# Patient Record
Sex: Male | Born: 1975 | Race: White | Hispanic: No | Marital: Single | Smoking: Never smoker
Health system: Southern US, Community
[De-identification: ages and names within clinical notes are randomized; demographics above are authoritative.]

## PROBLEM LIST (undated history)

## (undated) DIAGNOSIS — R079 Chest pain, unspecified: Secondary | ICD-10-CM

## (undated) DIAGNOSIS — R0602 Shortness of breath: Secondary | ICD-10-CM

## (undated) DIAGNOSIS — M549 Dorsalgia, unspecified: Secondary | ICD-10-CM

## (undated) HISTORY — DX: Shortness of breath: R06.02

## (undated) HISTORY — DX: Chest pain, unspecified: R07.9

## (undated) HISTORY — PX: APPENDECTOMY: SHX54

---

## 1999-03-29 ENCOUNTER — Emergency Department (HOSPITAL_COMMUNITY): Admission: EM | Admit: 1999-03-29 | Discharge: 1999-03-29 | Payer: Self-pay | Admitting: Emergency Medicine

## 2007-01-15 ENCOUNTER — Emergency Department (HOSPITAL_COMMUNITY): Admission: EM | Admit: 2007-01-15 | Discharge: 2007-01-15 | Payer: Self-pay | Admitting: Family Medicine

## 2007-06-20 ENCOUNTER — Emergency Department (HOSPITAL_COMMUNITY): Admission: EM | Admit: 2007-06-20 | Discharge: 2007-06-20 | Payer: Self-pay | Admitting: Family Medicine

## 2008-05-15 ENCOUNTER — Emergency Department (HOSPITAL_COMMUNITY): Admission: EM | Admit: 2008-05-15 | Discharge: 2008-05-15 | Payer: Self-pay | Admitting: Emergency Medicine

## 2008-05-26 ENCOUNTER — Emergency Department (HOSPITAL_COMMUNITY): Admission: EM | Admit: 2008-05-26 | Discharge: 2008-05-26 | Payer: Self-pay | Admitting: Family Medicine

## 2008-12-17 ENCOUNTER — Emergency Department (HOSPITAL_COMMUNITY): Admission: EM | Admit: 2008-12-17 | Discharge: 2008-12-17 | Payer: Self-pay | Admitting: Family Medicine

## 2009-04-03 ENCOUNTER — Emergency Department (HOSPITAL_COMMUNITY): Admission: EM | Admit: 2009-04-03 | Discharge: 2009-04-03 | Payer: Self-pay | Admitting: Family Medicine

## 2009-04-06 ENCOUNTER — Emergency Department (HOSPITAL_COMMUNITY): Admission: EM | Admit: 2009-04-06 | Discharge: 2009-04-06 | Payer: Self-pay | Admitting: Emergency Medicine

## 2009-08-29 ENCOUNTER — Encounter (INDEPENDENT_AMBULATORY_CARE_PROVIDER_SITE_OTHER): Payer: Self-pay | Admitting: Emergency Medicine

## 2009-08-29 ENCOUNTER — Emergency Department (HOSPITAL_COMMUNITY): Admission: EM | Admit: 2009-08-29 | Discharge: 2009-08-29 | Payer: Self-pay | Admitting: Family Medicine

## 2009-08-29 ENCOUNTER — Observation Stay (HOSPITAL_COMMUNITY): Admission: EM | Admit: 2009-08-29 | Discharge: 2009-08-29 | Payer: Self-pay | Admitting: Emergency Medicine

## 2009-08-29 ENCOUNTER — Ambulatory Visit: Payer: Self-pay | Admitting: Cardiology

## 2010-06-01 LAB — POCT I-STAT, CHEM 8
BUN: 11 mg/dL (ref 6–23)
Calcium, Ion: 1.18 mmol/L (ref 1.12–1.32)
Chloride: 104 mEq/L (ref 96–112)
Creatinine, Ser: 0.7 mg/dL (ref 0.4–1.5)
Glucose, Bld: 283 mg/dL — ABNORMAL HIGH (ref 70–99)
HCT: 48 % (ref 39.0–52.0)
Hemoglobin: 16.3 g/dL (ref 13.0–17.0)
Potassium: 4 mEq/L (ref 3.5–5.1)
Sodium: 138 meq/L (ref 135–145)
TCO2: 25 mmol/L (ref 0–100)

## 2010-06-01 LAB — HEMOGLOBIN A1C
Hgb A1c MFr Bld: 8.1 % — ABNORMAL HIGH (ref <5.7)
Mean Plasma Glucose: 186 mg/dL — ABNORMAL HIGH (ref <117)

## 2010-06-01 LAB — POCT CARDIAC MARKERS
CKMB, poc: <1 ng/mL — ABNORMAL LOW (ref 1.0–8.0)
CKMB, poc: <1 ng/mL — ABNORMAL LOW (ref 1.0–8.0)
Myoglobin, poc: 45.7 ng/mL (ref 12–200)
Myoglobin, poc: 46.6 ng/mL (ref 12–200)
Troponin i, poc: <0.05 ng/mL (ref 0.00–0.09)
Troponin i, poc: <0.05 ng/mL (ref 0.00–0.09)

## 2010-09-09 ENCOUNTER — Emergency Department (HOSPITAL_COMMUNITY)
Admission: EM | Admit: 2010-09-09 | Discharge: 2010-09-09 | Disposition: A | Discharge status: Home / Self Care | Payer: Self-pay | Attending: Emergency Medicine | Admitting: Emergency Medicine

## 2010-09-09 DIAGNOSIS — M545 Low back pain, unspecified: Secondary | ICD-10-CM | POA: Insufficient documentation

## 2010-09-09 DIAGNOSIS — M79609 Pain in unspecified limb: Secondary | ICD-10-CM | POA: Insufficient documentation

## 2010-09-09 DIAGNOSIS — R059 Cough, unspecified: Secondary | ICD-10-CM | POA: Insufficient documentation

## 2010-09-09 DIAGNOSIS — E119 Type 2 diabetes mellitus without complications: Secondary | ICD-10-CM | POA: Insufficient documentation

## 2010-09-09 DIAGNOSIS — R05 Cough: Secondary | ICD-10-CM | POA: Insufficient documentation

## 2010-09-09 DIAGNOSIS — Z79899 Other long term (current) drug therapy: Secondary | ICD-10-CM | POA: Insufficient documentation

## 2010-09-14 ENCOUNTER — Ambulatory Visit (INDEPENDENT_AMBULATORY_CARE_PROVIDER_SITE_OTHER): Payer: Self-pay

## 2010-09-14 ENCOUNTER — Inpatient Hospital Stay (INDEPENDENT_AMBULATORY_CARE_PROVIDER_SITE_OTHER)
Admission: RE | Admit: 2010-09-14 | Discharge: 2010-09-14 | Disposition: A | Discharge status: Home / Self Care | Payer: Self-pay | Source: Ambulatory Visit | Attending: Family Medicine | Admitting: Family Medicine

## 2010-09-14 DIAGNOSIS — M549 Dorsalgia, unspecified: Secondary | ICD-10-CM

## 2010-09-14 DIAGNOSIS — M461 Sacroiliitis, not elsewhere classified: Secondary | ICD-10-CM

## 2010-10-27 ENCOUNTER — Inpatient Hospital Stay (INDEPENDENT_AMBULATORY_CARE_PROVIDER_SITE_OTHER)
Admission: RE | Admit: 2010-10-27 | Discharge: 2010-10-27 | Disposition: A | Discharge status: Home / Self Care | Payer: Self-pay | Source: Ambulatory Visit | Attending: Emergency Medicine | Admitting: Emergency Medicine

## 2010-10-27 DIAGNOSIS — S335XXA Sprain of ligaments of lumbar spine, initial encounter: Secondary | ICD-10-CM

## 2010-12-01 ENCOUNTER — Emergency Department (HOSPITAL_COMMUNITY)
Admission: EM | Admit: 2010-12-01 | Discharge: 2010-12-01 | Disposition: A | Discharge status: Home / Self Care | Payer: Self-pay | Attending: Emergency Medicine | Admitting: Emergency Medicine

## 2010-12-01 DIAGNOSIS — M545 Low back pain, unspecified: Secondary | ICD-10-CM | POA: Insufficient documentation

## 2010-12-01 DIAGNOSIS — IMO0001 Reserved for inherently not codable concepts without codable children: Secondary | ICD-10-CM | POA: Insufficient documentation

## 2010-12-01 DIAGNOSIS — E119 Type 2 diabetes mellitus without complications: Secondary | ICD-10-CM | POA: Insufficient documentation

## 2010-12-01 DIAGNOSIS — M79609 Pain in unspecified limb: Secondary | ICD-10-CM | POA: Insufficient documentation

## 2010-12-01 DIAGNOSIS — M5430 Sciatica, unspecified side: Secondary | ICD-10-CM | POA: Insufficient documentation

## 2010-12-01 DIAGNOSIS — G8929 Other chronic pain: Secondary | ICD-10-CM | POA: Insufficient documentation

## 2011-03-03 ENCOUNTER — Other Ambulatory Visit (HOSPITAL_COMMUNITY): Payer: Self-pay | Admitting: Internal Medicine

## 2011-03-03 DIAGNOSIS — M545 Low back pain: Secondary | ICD-10-CM

## 2011-03-04 ENCOUNTER — Ambulatory Visit (HOSPITAL_COMMUNITY)
Admission: RE | Admit: 2011-03-04 | Discharge: 2011-03-04 | Disposition: A | Discharge status: Home / Self Care | Payer: Self-pay | Source: Ambulatory Visit | Attending: Internal Medicine | Admitting: Internal Medicine

## 2011-03-04 ENCOUNTER — Other Ambulatory Visit (HOSPITAL_COMMUNITY): Payer: Self-pay | Admitting: Internal Medicine

## 2011-03-04 DIAGNOSIS — G8929 Other chronic pain: Secondary | ICD-10-CM

## 2011-03-04 DIAGNOSIS — W311XXA Contact with metalworking machines, initial encounter: Secondary | ICD-10-CM

## 2011-03-04 DIAGNOSIS — Z1389 Encounter for screening for other disorder: Secondary | ICD-10-CM | POA: Insufficient documentation

## 2011-03-04 DIAGNOSIS — R29898 Other symptoms and signs involving the musculoskeletal system: Secondary | ICD-10-CM | POA: Insufficient documentation

## 2011-03-04 DIAGNOSIS — M545 Low back pain, unspecified: Secondary | ICD-10-CM | POA: Insufficient documentation

## 2011-06-08 ENCOUNTER — Encounter (HOSPITAL_COMMUNITY): Payer: Self-pay | Admitting: Emergency Medicine

## 2011-06-08 ENCOUNTER — Emergency Department (HOSPITAL_COMMUNITY)
Admission: EM | Admit: 2011-06-08 | Discharge: 2011-06-09 | Disposition: A | Discharge status: Home / Self Care | Payer: Self-pay | Attending: Emergency Medicine | Admitting: Emergency Medicine

## 2011-06-08 DIAGNOSIS — E119 Type 2 diabetes mellitus without complications: Secondary | ICD-10-CM | POA: Insufficient documentation

## 2011-06-08 DIAGNOSIS — G8929 Other chronic pain: Secondary | ICD-10-CM | POA: Insufficient documentation

## 2011-06-08 DIAGNOSIS — L0231 Cutaneous abscess of buttock: Secondary | ICD-10-CM | POA: Insufficient documentation

## 2011-06-08 DIAGNOSIS — L03317 Cellulitis of buttock: Secondary | ICD-10-CM | POA: Insufficient documentation

## 2011-06-08 NOTE — ED Notes (Signed)
Pt states he has an abscess to his left buttock  Pt states it started on Friday and has progressively gotten worse

## 2011-06-09 MED ORDER — OXYCODONE-ACETAMINOPHEN 5-325 MG PO TABS: ORAL_TABLET | ORAL | Status: AC

## 2011-06-09 MED ORDER — SULFAMETHOXAZOLE-TRIMETHOPRIM 800-160 MG PO TABS: 1.0000 | ORAL_TABLET | Freq: Two times a day (BID) | ORAL | Status: AC

## 2011-06-09 MED ORDER — IBUPROFEN 800 MG PO TABS
800.0000 mg | ORAL_TABLET | Freq: Once | ORAL | Status: AC
  Administered 2011-06-09: 800 mg via ORAL
  Filled 2011-06-09: qty 1

## 2011-06-09 NOTE — ED Notes (Signed)
Pt reports having an area on left buttock that started hurting for past couple of days. Thought he got bitten by an insect or had hemorrhoids. Pt has a reddened area approximately the size of half dollar that is swollen and inflamed. Exudate oozes from site when site is pressed down.

## 2011-06-09 NOTE — ED Notes (Signed)
Drucie Opitz, PA at the bedside assessing pt.

## 2011-06-09 NOTE — ED Provider Notes (Signed)
Medical screening examination/treatment/procedure(s) were performed by non-physician practitioner and as supervising physician I was immediately available for consultation/collaboration.   Vida Roller, MD 06/09/11 [id number]

## 2011-06-09 NOTE — ED Provider Notes (Signed)
History     CSN: [id number]  Arrival date & time 06/08/11  2218   First MD Initiated Contact with Patient 06/08/11 2353      Chief Complaint  Patient presents with  . Abscess    (Consider location/radiation/quality/duration/timing/severity/associated sxs/prior treatment) HPI  Patient who is a non-insulin-dependent diabetic with chronic back pain presents to emergency department complaining of a few day history of gradual onset pain in his left inner buttock stating "I felt a bump there." Patient states he first felt a bump on the left inner aspect of his buttock a few days ago her last few days the area has become more hot and tender. Patient states increased pain with sitting. Patient states that today he began to have some drainage from the area. Patient denies any pain with defecation, fevers, chills, or abdominal pain. Patient states she's taken at home Percocet with some mild relief of pain. Pain was gradual onset, persistent, and worsening. Pain is aggravated by sitting and touch and improved with lying on his belly.  Past Medical History  Diagnosis Date  . Diabetes mellitus     Past Surgical History  Procedure Date  . Appendectomy     Family History  Problem Relation Age of Onset  . Diabetes Other   . Cancer Other     History  Substance Use Topics  . Smoking status: Never Smoker   . Smokeless tobacco: Not on file  . Alcohol Use: No      Review of Systems  All other systems reviewed and are negative.    Allergies  Review of patient's allergies indicates no known allergies.  Home Medications   Current Outpatient Rx  Name Route Sig Dispense Refill  . DICLOFENAC SODIUM 50 MG PO TBEC Oral Take 50 mg by mouth 2 (two) times daily.    Marland Kitchen METFORMIN HCL 500 MG PO TABS Oral Take 500 mg by mouth daily with breakfast.    . OXYCODONE-ACETAMINOPHEN 5-325 MG PO TABS Oral Take 1 tablet by mouth every 4 (four) hours as needed.      BP 142/86  Pulse 97  Temp(Src)  98.1 F (36.7 C) (Oral)  Resp 20  SpO2 99%  Physical Exam  Nursing note and vitals reviewed. Constitutional: He is oriented to person, place, and time. He appears well-developed and well-nourished. No distress.  HENT:  Head: Normocephalic and atraumatic.  Eyes: Conjunctivae are normal.  Neck: Normal range of motion. Neck supple.  Cardiovascular: Normal rate, regular rhythm, normal heart sounds and intact distal pulses.  Exam reveals no gallop and no friction rub.   No murmur heard. Pulmonary/Chest: Effort normal and breath sounds normal. No respiratory distress. He has no wheezes. He has no rales. He exhibits no tenderness.  Abdominal: Soft. Bowel sounds are normal. He exhibits no distension and no mass. There is no tenderness. There is no rebound and no guarding.  Genitourinary: Rectum normal.  Musculoskeletal: Normal range of motion. He exhibits no edema and no tenderness.  Neurological: He is alert and oriented to person, place, and time.  Skin: Skin is warm and dry. No rash noted. He is not diaphoretic. No erythema.       Quarter sized area of erythema and induration of left inner buttock with blood tinged purulent drainage. Moderate TTP. No surrounding soft tissue crepitous or erythema or TTP.   Psychiatric: He has a normal mood and affect.    ED Course  Procedures (including critical care time)  Patient drove self to ER.  PO ibuprofen  INCISION AND DRAINAGE Performed by: Jenness Corner Consent: Verbal consent obtained. Risks and benefits: risks, benefits and alternatives were discussed Type: abscess  Body area: left inner buttock  Anesthesia: local infiltration  Local anesthetic: lidocaine 2% with epinephrine  Anesthetic total: 8 ml  Complexity: complex Blunt dissection to break up loculations  Drainage: purulent  Drainage amount: moderate amount  Packing material: none  Patient tolerance: Patient tolerated the procedure well with no immediate  complications.     Labs Reviewed - No data to display No results found.   1. Abscess of buttock       MDM  Abscess without signs or symptoms of associated cellulitis or deeper infection. No pain with defecation. No rectal tenderness to palpation. Patient afebrile nontoxic-appearing. Abdomen soft nontender. Patient is appropriate for outpatient treatment of abscess with strict precautions to return to ER given. Patient voices understanding are agreeable to        Jenness Corner, [place] 06/09/11 0201

## 2011-06-09 NOTE — Discharge Instructions (Signed)
Apply warm compresses to buttock throughout the day. Take antibiotic and completion. Take percocet as directed, as needed for pain but do not drive or operate machinery with pain medication use. Followup with [facility] in 3 days for wound recheck for ongoing redness or questions of healing.  However return to emergency department for emergent changing or worsening symptoms.   Abscess An abscess (boil or furuncle) is an infected area that contains a collection of pus.  SYMPTOMS Signs and symptoms of an abscess include pain, tenderness, redness, or hardness. You may feel a moveable soft area under your skin. An abscess can occur anywhere in the body.  TREATMENT  A surgical cut (incision) may be made over your abscess to drain the pus. Gauze may be packed into the space or a drain may be looped through the abscess cavity (pocket). This provides a drain that will allow the cavity to heal from the inside outwards. The abscess may be painful for a few days, but should feel much better if it was drained.  Your abscess, if seen early, may not have localized and may not have been drained. If not, another appointment may be required if it does not get better on its own or with medications. HOME CARE INSTRUCTIONS   Only take over-the-counter or prescription medicines for pain, discomfort, or fever as directed by your caregiver.   Take your antibiotics as directed if they were prescribed. Finish them even if you start to feel better.   Keep the skin and clothes clean around your abscess.   If the abscess was drained, you will need to use gauze dressing to collect any draining pus. Dressings will typically need to be changed 3 or more times a day.   The infection may spread by skin contact with others. Avoid skin contact as much as possible.   Practice good hygiene. This includes regular hand washing, cover any draining skin lesions, and do not share personal care items.   If you participate  in sports, do not share athletic equipment, towels, whirlpools, or personal care items. Shower after every practice or tournament.   If a draining area cannot be adequately covered:   Do not participate in sports.   Children should not participate in day care until the wound has healed or drainage stops.   If your caregiver has given you a follow-up appointment, it is very important to keep that appointment. Not keeping the appointment could result in a much worse infection, chronic or permanent injury, pain, and disability. If there is any problem keeping the appointment, you must call back to this facility for assistance.  SEEK MEDICAL CARE IF:   You develop increased pain, swelling, redness, drainage, or bleeding in the wound site.   You develop signs of generalized infection including muscle aches, chills, fever, or a general ill feeling.   You have an oral temperature above 102 F (38.9 C).  MAKE SURE YOU:   Understand these instructions.   Will watch your condition.   Will get help right away if you are not doing well or get worse.  Document Released: 12/09/2004 Document Revised: 02/18/2011 Document Reviewed: 10/03/2007 [facility] Patient Information 2012 [place], [place].

## 2011-09-12 ENCOUNTER — Emergency Department (HOSPITAL_COMMUNITY): Payer: Self-pay

## 2011-09-12 ENCOUNTER — Emergency Department (HOSPITAL_COMMUNITY)
Admission: EM | Admit: 2011-09-12 | Discharge: 2011-09-13 | Disposition: A | Discharge status: Home / Self Care | Payer: Self-pay | Attending: Emergency Medicine | Admitting: Emergency Medicine

## 2011-09-12 DIAGNOSIS — R52 Pain, unspecified: Secondary | ICD-10-CM | POA: Insufficient documentation

## 2011-09-12 DIAGNOSIS — R059 Cough, unspecified: Secondary | ICD-10-CM | POA: Insufficient documentation

## 2011-09-12 DIAGNOSIS — R509 Fever, unspecified: Secondary | ICD-10-CM | POA: Insufficient documentation

## 2011-09-12 DIAGNOSIS — R05 Cough: Secondary | ICD-10-CM

## 2011-09-12 DIAGNOSIS — Z79899 Other long term (current) drug therapy: Secondary | ICD-10-CM | POA: Insufficient documentation

## 2011-09-12 LAB — CBC WITH DIFFERENTIAL/PLATELET
Basophils Relative: 2 % — ABNORMAL HIGH (ref 0–1)
Eosinophils Absolute: 0.2 10*3/uL (ref 0.0–0.7)
MCH: 29.3 pg (ref 26.0–34.0)
Monocytes Absolute: 0.6 10*3/uL (ref 0.1–1.0)
Monocytes Relative: 10 % (ref 3–12)
Neutrophils Relative %: 71 % (ref 43–77)
RBC: 5.69 MIL/uL (ref 4.22–5.81)
RDW: 12.8 % (ref 11.5–15.5)

## 2011-09-12 LAB — COMPREHENSIVE METABOLIC PANEL
Albumin: 3.7 g/dL (ref 3.5–5.2)
Alkaline Phosphatase: 115 U/L (ref 39–117)
CO2: 22 mEq/L (ref 19–32)
Calcium: 9.5 mg/dL (ref 8.4–10.5)
Chloride: 98 mEq/L (ref 96–112)
Creatinine, Ser: 0.69 mg/dL (ref 0.50–1.35)
GFR calc Af Amer: >90 mL/min (ref >90)
GFR calc non Af Amer: >90 mL/min (ref >90)
Total Bilirubin: 0.4 mg/dL (ref 0.3–1.2)
Total Protein: 7.3 g/dL (ref 6.0–8.3)

## 2011-09-12 LAB — D-DIMER, QUANTITATIVE: D-Dimer, Quant: 0.48 ug/mL-FEU (ref 0.00–0.48)

## 2011-09-12 MED ORDER — ACETAMINOPHEN 325 MG PO TABS
975.0000 mg | ORAL_TABLET | Freq: Once | ORAL | Status: AC
  Administered 2011-09-12: 975 mg via ORAL
  Filled 2011-09-12: qty 3

## 2011-09-12 MED ORDER — MORPHINE SULFATE 4 MG/ML IJ SOLN
4.0000 mg | Freq: Once | INTRAMUSCULAR | Status: AC
  Administered 2011-09-12: 4 mg via INTRAVENOUS
  Filled 2011-09-12: qty 1

## 2011-09-12 MED ORDER — KETOROLAC TROMETHAMINE 30 MG/ML IJ SOLN
30.0000 mg | Freq: Once | INTRAMUSCULAR | Status: AC
  Administered 2011-09-13: 30 mg via INTRAVENOUS
  Filled 2011-09-12: qty 1

## 2011-09-12 MED ORDER — SODIUM CHLORIDE 0.9 % IV BOLUS (SEPSIS)
1000.0000 mL | Freq: Once | INTRAVENOUS | Status: AC
  Administered 2011-09-12: 1000 mL via INTRAVENOUS

## 2011-09-12 NOTE — ED Notes (Signed)
Per PTAR:  Pt reports fever of 101.0 (reported from mother) this morning with midsternal cp and cough - Pt rates pain 9/10

## 2011-09-13 MED ORDER — AZITHROMYCIN 250 MG PO TABS: 250.0000 mg | ORAL_TABLET | Freq: Every day | ORAL | Status: AC

## 2011-09-13 NOTE — ED Provider Notes (Addendum)
History     CSN: [id number]  Arrival date & time 09/12/11  2030   First MD Initiated Contact with Patient 09/12/11 2034      Chief Complaint  Patient presents with  . Fever    (Consider location/radiation/quality/duration/timing/severity/associated sxs/prior treatment) HPI  36yoM ho non-insulin-dependent diabetes presents with fever. The patient reports fever up to 101 just prior to arrival. He states he did not take anything for the fever. He also reports 2 days of right-sided chest pain which has been constant. He denies radiation to his back. He denies nausea, vomiting, diaphoresis. The chest pain is not positional. He denies shortness of breath, palpitations. He does complain of mild nonproductive cough. He has nasal congestion and rhinorrhea. Denies sore throat. No sick contacts. Denies h/o VTE in self +DVT in mother. No recent hosp/surg/immob. No h/o cancer. Denies exogenous hormone use, no leg pain or swelling  Chronic back pain at baseline  Also c/o b/l axilla pain x weeks unk cause   ED Notes, ED Provider Notes from 09/12/11 0000 to 09/12/11 20:40:34       Nyoka J Key, RN 09/12/2011 20:40      Per PTAR: Pt reports fever of 101.0 (reported from mother) this morning with midsternal cp and cough - Pt rates pain 9/10     Past Medical History  Diagnosis Date  . Diabetes mellitus     Past Surgical History  Procedure Date  . Appendectomy     Family History  Problem Relation Age of Onset  . Diabetes Other   . Cancer Other     History  Substance Use Topics  . Smoking status: Never Smoker   . Smokeless tobacco: Not on file  . Alcohol Use: No      Review of Systems  All other systems reviewed and are negative.   except as noted HPI   Allergies  Review of patient's allergies indicates no known allergies.  Home Medications   Current Outpatient Rx  Name Route Sig Dispense Refill  . DICLOFENAC SODIUM 50 MG PO TBEC Oral Take 50 mg by mouth 2 (two) times  daily as needed. For pain    . METFORMIN HCL 500 MG PO TABS Oral Take 500 mg by mouth daily with breakfast.    . OXYCODONE-ACETAMINOPHEN 10-325 MG PO TABS Oral Take 1 tablet by mouth daily as needed. For pain    . AZITHROMYCIN 250 MG PO TABS Oral Take 1 tablet (250 mg total) by mouth daily. Take first 2 tablets together, then 1 every day until finished. 6 tablet 0    BP 106/55  Pulse 95  Temp 98.3 F (36.8 C) (Oral)  Resp 16  Ht 5\' 9"  (1.753 m)  Wt 271 lb (122.925 kg)  BMI 40.02 kg/m2  SpO2 96%  Physical Exam  Nursing note and vitals reviewed. Constitutional: He is oriented to person, place, and time. He appears well-developed and well-nourished. No distress.  HENT:  Head: Atraumatic.  Mouth/Throat: Oropharynx is clear and moist.  Eyes: Conjunctivae are normal. Pupils are equal, round, and reactive to light.  Neck: Neck supple.  Cardiovascular: Normal rate, regular rhythm, normal heart sounds and intact distal pulses.  Exam reveals no gallop and no friction rub.   No murmur heard. Pulmonary/Chest: Effort normal. No respiratory distress. He has no wheezes. He has no rales. He exhibits no tenderness.  Abdominal: Soft. Bowel sounds are normal. There is tenderness. There is no rebound and no guarding.  Min left abd ttp (pt denies abd pain otherwise)  Musculoskeletal: Normal range of motion. He exhibits no edema and no tenderness.  Neurological: He is alert and oriented to person, place, and time.  Skin: Skin is warm and dry.       B/l axillae unremarkable. No LAD or abscess noted  Psychiatric: He has a normal mood and affect.    Date: 09/13/2011  Rate: 109 SINUS TACHYCARDIA ~ V-rate> 99 ABERRANT COMPLEX, POSSIBLY SUPRAVENTRICULAR ~ aberrant shape, PR 80-220 LAD, CONSIDER LEFT ANTERIOR FASCICULAR BLOCK ~ axis(240,-40), S>R II III aVF Standard 12 Lead   ED Course  Procedures (including critical care time)  Labs Reviewed  CBC WITH DIFFERENTIAL - Abnormal; Notable for the  following:    MCHC 36.9 (*)     Basophils Relative 2 (*)     All other components within normal limits  COMPREHENSIVE METABOLIC PANEL - Abnormal; Notable for the following:    Sodium 133 (*)     Glucose, Bld 236 (*)     All other components within normal limits  LIPASE, BLOOD  TROPONIN I  D-DIMER, QUANTITATIVE   Dg Chest 2 View  09/12/2011  *RADIOLOGY REPORT*  Clinical Data: Fever, arm pain.  CHEST - 2 VIEW  Comparison: None.  Findings: Heart and mediastinal contours are within normal limits. No focal opacities or effusions.  No acute bony abnormality.  IMPRESSION: No active cardiopulmonary disease.  Original Report Authenticated By: Cyndie Chime, M.D.    1. Body aches   2. Cough   3. Fever     MDM  Fever, body aches, cough, cp. The patient spiked a fever to 102 in the emergency department. He was responsive to Tylenol. His pain was controlled with morphine and Toradol. His chest x-ray is unremarkable. He is not having signs or symptoms of urinary tract infection. He denies pain at this time including no abdominal pain. He is advised to follow with his primary care Dr. Stann Mainland prescribe azithromycin for possible bronchitis versus early pneumonia although he does not have wheezing on exam. No EMC precluding discharge at this time. Given Precautions for return. PMD f/u.         Forbes Cellar, MD 09/13/11 1308  Forbes Cellar, MD 09/13/11 [id number]

## 2011-09-13 NOTE — Discharge Instructions (Signed)
Fever  Fever is a higher-than-normal body temperature. A normal temperature varies with:  Age.   How it is measured (mouth, underarm, rectal, or ear).   Time of day.  In an adult, an oral temperature around 98.6 Fahrenheit (F) or 37 Celsius (C) is considered normal. A rise in temperature of about 1.8 F or 1 C is generally considered a fever (100.4 F or 38 C). In an infant age 37 days or less, a rectal temperature of 100.4 F (38 C) generally is regarded as fever. Fever is not a disease but can be a symptom of illness. CAUSES   Fever is most commonly caused by infection.   Some non-infectious problems can cause fever. For example:   Some arthritis problems.   Problems with the thyroid or adrenal glands.   Immune system problems.   Some kinds of cancer.   A reaction to certain medicines.   Occasionally, the source of a fever cannot be determined. This is sometimes called a "Fever of Unknown Origin" (FUO).   Some situations may lead to a temporary rise in body temperature that may go away on its own. Examples are:   Childbirth.   Surgery.   Some situations may cause a rise in body temperature but these are not considered "true fever". Examples are:   Intense exercise.   Dehydration.   Exposure to high outside or room temperatures.  SYMPTOMS   Feeling warm or hot.   Fatigue or feeling exhausted.   Aching all over.   Chills.   Shivering.   Sweats.  DIAGNOSIS  A fever can be suspected by your caregiver feeling that your skin is unusually warm. The fever is confirmed by taking a temperature with a thermometer. Temperatures can be taken different ways. Some methods are accurate and some are not: With adults, adolescents, and children:   An oral temperature is used most commonly.   An ear thermometer will only be accurate if it is positioned as recommended by the manufacturer.   Under the arm temperatures are not accurate and not recommended.   Most  electronic thermometers are fast and accurate.  Infants and Toddlers:  Rectal temperatures are recommended and most accurate.   Ear temperatures are not accurate in this age group and are not recommended.   Skin thermometers are not accurate.  RISKS AND COMPLICATIONS   During a fever, the body uses more oxygen, so a person with a fever may develop rapid breathing or shortness of breath. This can be dangerous especially in people with heart or lung disease.   The sweats that occur following a fever can cause dehydration.   High fever can cause seizures in infants and children.   Older persons can develop confusion during a fever.  TREATMENT   Medications may be used to control temperature.   Do not give aspirin to children with fevers. There is an association with Reye's syndrome. Reye's syndrome is a rare but potentially deadly disease.   If an infection is present and medications have been prescribed, take them as directed. Finish the full course of medications until they are gone.   Sponging or bathing with room-temperature water may help reduce body temperature. Do not use ice water or alcohol sponge baths.   Do not over-bundle children in blankets or heavy clothes.   Drinking adequate fluids during an illness with fever is important to prevent dehydration.  HOME CARE INSTRUCTIONS   For adults, rest and adequate fluid intake are important. Dress according   to how you feel, but do not over-bundle.   Drink enough water and/or fluids to keep your urine clear or pale yellow.   For infants over 3 months and children, giving medication as directed by your caregiver to control fever can help with comfort. The amount to be given is based on the child's weight. Do NOT give more than is recommended.  SEEK MEDICAL CARE IF:   You or your child are unable to keep fluids down.   Vomiting or diarrhea develops.   You develop a skin rash.   An oral temperature above 102 F (38.9 C)  develops, or a fever which persists for over 3 days.   You develop excessive weakness, dizziness, fainting or extreme thirst.   Fevers keep coming back after 3 days.  SEEK IMMEDIATE MEDICAL CARE IF:   Shortness of breath or trouble breathing develops   You pass out.   You feel you are making little or no urine.   New pain develops that was not there before (such as in the head, neck, chest, back, or abdomen).   You cannot hold down fluids.   Vomiting and diarrhea persist for more than a day or two.   You develop a stiff neck and/or your eyes become sensitive to light.   An unexplained temperature above 102 F (38.9 C) develops.  Document Released: 03/01/2005 Document Revised: 02/18/2011 Document Reviewed: 02/15/2008 [facility] Patient Information 2012 [place], [place].  RESOURCE GUIDE  Dental Problems  Patients with Medicaid: [facility] [phone] [address]                                           [phone] [address] Phone:  [phone]                                                   Phone:  [phone]  If unable to pay or uninsured, contact:  [facility] or [facility]. to become qualified for the adult dental clinic.  Chronic Pain Problems Contact [facility]  [phone] Patients need to be referred by their primary care doctor.  Insufficient Money for Medicine Contact [employer]:  call "211" or [facility] [phone].  No Primary Care Doctor Call [facility]  [phone] Other agencies that provide inexpensive medical care    [facility]  [phone]    [facility]  [phone]    [facility]  [phone]    [facility]  [phone]    [facility]  [phone]    [facility]  [phone]  [facility]  [phone] [facility]  [phone] [facility]   [phone]  (emergency services [phone])  Abuse/Neglect [facility] Child Abuse Hotline 660 530 9389 [facility] Child Abuse Hotline 318-695-6068 (After Hours)  Emergency Shelter [facility] [phone])[phone]  M[facility] [place] [facility] 7795887845 [facility] [phone]  MRSA Hotline #:   225 461 6969    [facility]  [facility] Dept. [address] [place]                     [address]         [address]  Blondell Reveal Phone:  [phone]                                  Phone:  [phone]                   Phone:  [phone]  [facility] Phone:  [phone]  [facility] Child Abuse Hotline 7624776215 [phone] (After Hours)

## 2011-09-13 NOTE — ED Notes (Signed)
The patient is AOx4 and comfortable with his discharge instructions. 

## 2012-03-27 ENCOUNTER — Emergency Department (HOSPITAL_COMMUNITY): Payer: Self-pay

## 2012-03-27 ENCOUNTER — Emergency Department (HOSPITAL_COMMUNITY)
Admission: EM | Admit: 2012-03-27 | Discharge: 2012-03-28 | Disposition: A | Discharge status: Home / Self Care | Payer: Self-pay | Attending: Emergency Medicine | Admitting: Emergency Medicine

## 2012-03-27 ENCOUNTER — Encounter (HOSPITAL_COMMUNITY): Payer: Self-pay | Admitting: *Deleted

## 2012-03-27 DIAGNOSIS — Z79899 Other long term (current) drug therapy: Secondary | ICD-10-CM | POA: Insufficient documentation

## 2012-03-27 DIAGNOSIS — R0789 Other chest pain: Secondary | ICD-10-CM | POA: Insufficient documentation

## 2012-03-27 DIAGNOSIS — E669 Obesity, unspecified: Secondary | ICD-10-CM | POA: Insufficient documentation

## 2012-03-27 DIAGNOSIS — IMO0001 Reserved for inherently not codable concepts without codable children: Secondary | ICD-10-CM | POA: Insufficient documentation

## 2012-03-27 DIAGNOSIS — R0602 Shortness of breath: Secondary | ICD-10-CM | POA: Insufficient documentation

## 2012-03-27 DIAGNOSIS — R739 Hyperglycemia, unspecified: Secondary | ICD-10-CM

## 2012-03-27 NOTE — ED Notes (Signed)
Pt c/o right side chest pain that started "a month ago" that has now spread to left side of chest. Denies n/v. States SOB at times when pain comes. Describes pain as constant on right side and shooting/stabbing on left side.

## 2012-03-27 NOTE — ED Notes (Signed)
ekg complete

## 2012-03-28 LAB — COMPREHENSIVE METABOLIC PANEL
Albumin: 3.7 g/dL (ref 3.5–5.2)
Alkaline Phosphatase: 117 U/L (ref 39–117)
BUN: 14 mg/dL (ref 6–23)
CO2: 20 mEq/L (ref 19–32)
Chloride: 101 mEq/L (ref 96–112)
Potassium: 3.9 mEq/L (ref 3.5–5.1)
Total Bilirubin: 0.4 mg/dL (ref 0.3–1.2)

## 2012-03-28 LAB — CBC WITH DIFFERENTIAL/PLATELET
Basophils Absolute: 0.1 10*3/uL (ref 0.0–0.1)
Basophils Relative: 1 % (ref 0–1)
Eosinophils Relative: 1 % (ref 0–5)
HCT: 45.4 % (ref 39.0–52.0)
Hemoglobin: 16.6 g/dL (ref 13.0–17.0)
Lymphocytes Relative: 30 % (ref 12–46)
Monocytes Relative: 7 % (ref 3–12)
Neutro Abs: 4.3 10*3/uL (ref 1.7–7.7)
RBC: 5.71 MIL/uL (ref 4.22–5.81)
RDW: 12.9 % (ref 11.5–15.5)
WBC: 7.1 10*3/uL (ref 4.0–10.5)

## 2012-03-28 LAB — LIPASE, BLOOD: Lipase: 39 U/L (ref 11–59)

## 2012-03-28 NOTE — ED Notes (Signed)
PA at bedside.

## 2012-03-28 NOTE — ED Provider Notes (Signed)
Medical screening examination/treatment/procedure(s) were performed by non-physician practitioner and as supervising physician I was immediately available for consultation/collaboration.  Olivia Mackie, MD 03/28/12 0530

## 2012-03-28 NOTE — ED Provider Notes (Signed)
History     CSN: [id number]  Arrival date & time 03/27/12  2254   First MD Initiated Contact with Patient 03/28/12 0049      Chief Complaint  Patient presents with  . Chest Pain    (Consider location/radiation/quality/duration/timing/severity/associated sxs/prior treatment) Patient is a 37 y.o. male presenting with chest pain. The history is provided by the patient.  Chest Pain Episode onset: 1 month ago  Duration of episode(s) is 2 minutes. Chest pain occurs intermittently. The chest pain is unchanged. Associated with: no known associating factors. At its most intense, the pain is at 10/10. The pain is currently at 7/10. The severity of the pain is moderate. The quality of the pain is described as sharp and stabbing. Radiates to: across chest  Chest pain is worsened by certain positions. Primary symptoms include shortness of breath. Pertinent negatives for primary symptoms include no fever, no fatigue, no syncope, no cough, no palpitations, no abdominal pain, no nausea, no vomiting, no dizziness and no altered mental status.  Pertinent negatives for associated symptoms include no claudication, no diaphoresis, no lower extremity edema, no near-syncope, no numbness, no orthopnea, no paroxysmal nocturnal dyspnea and no weakness. He tried nothing for the symptoms. Risk factors include male gender, lack of exercise and obesity.  His past medical history is significant for diabetes.  His family medical history is significant for CAD in family (father).  Procedure history is positive for exercise treadmill test ("a couple of years ago, everything was fine").  Procedure history is negative for cardiac catheterization and echocardiogram.     Past Medical History  Diagnosis Date  . Diabetes mellitus     Past Surgical History  Procedure Date  . Appendectomy     Family History  Problem Relation Age of Onset  . Diabetes Other   . Cancer Other     History  Substance Use Topics  .  Smoking status: Never Smoker   . Smokeless tobacco: Not on file  . Alcohol Use: No      Review of Systems  Constitutional: Negative for fever, diaphoresis and fatigue.  Respiratory: Positive for shortness of breath. Negative for cough.   Cardiovascular: Positive for chest pain. Negative for palpitations, orthopnea, claudication, leg swelling, syncope and near-syncope.  Gastrointestinal: Negative for nausea, vomiting and abdominal pain.  Neurological: Negative for dizziness, weakness and numbness.  Psychiatric/Behavioral: Negative for altered mental status.  All other systems reviewed and are negative.    Allergies  Review of patient's allergies indicates no known allergies.  Home Medications   Current Outpatient Rx  Name  Route  Sig  Dispense  Refill  . METFORMIN HCL 500 MG PO TABS   Oral   Take 500 mg by mouth daily with breakfast.         . OXYCODONE-ACETAMINOPHEN 10-325 MG PO TABS   Oral   Take 1 tablet by mouth daily as needed. For pain           BP 125/67  Pulse 73  Temp 98.3 F (36.8 C) (Oral)  Resp 20  SpO2 95%  Physical Exam  Nursing note and vitals reviewed. Constitutional: He appears well-developed and well-nourished. No distress.  HENT:  Head: Normocephalic and atraumatic.  Eyes: Conjunctivae normal and EOM are normal. Pupils are equal, round, and reactive to light.  Neck: Normal range of motion. Neck supple. Normal carotid pulses and no JVD present. Carotid bruit is not present. No rigidity. Normal range of motion present.  Cardiovascular: Normal rate, regular  rhythm, S1 normal, S2 normal, normal heart sounds, intact distal pulses and normal pulses.  Exam reveals no gallop and no friction rub.   No murmur heard.      No pitting edema bilaterally, RRR, no aberrant sounds on auscultations, distal pulses intact, no carotid bruit or JVD.   Pulmonary/Chest: Effort normal and breath sounds normal. No accessory muscle usage or stridor. No respiratory  distress. He exhibits no tenderness and no bony tenderness.  Abdominal: Bowel sounds are normal.       Soft non tender. Non pulsatile aorta.   Skin: Skin is warm, dry and intact. No rash noted. He is not diaphoretic. No cyanosis. Nails show no clubbing.    ED Course  Procedures (including critical care time)  Labs Reviewed  CBC WITH DIFFERENTIAL - Abnormal; Notable for the following:    MCHC 36.6 (*)     All other components within normal limits  COMPREHENSIVE METABOLIC PANEL - Abnormal; Notable for the following:    Glucose, Bld 418 (*)     All other components within normal limits  LIPASE, BLOOD  POCT I-STAT TROPONIN I   Dg Chest 2 View  03/27/2012  *RADIOLOGY REPORT*  Clinical Data: Chest pain and congestion for 1 month.  CHEST - 2 VIEW  Comparison: 09/12/2011  Findings: The heart size and pulmonary vascularity are normal. The lungs appear clear and expanded without focal air space disease or consolidation. No blunting of the costophrenic angles.  No pneumothorax.  Mediastinal contours appear intact.  No significant change since previous study.  IMPRESSION: No evidence of active pulmonary disease.   Original Report Authenticated By: Burman Nieves, M.D.    . Date: 03/28/2012  Rate: 71  Rhythm: normal sinus rhythm  QRS Axis: normal  Intervals: normal  ST/T Wave abnormalities: normal  Conduction Disutrbances: none  Narrative Interpretation:   Old EKG Reviewed: No old   No diagnosis found.  BP 125/67  Pulse 73  Temp 98.3 F (36.8 C) (Oral)  Resp 20  SpO2 95%   MDM  Hyperglycemia, CP-atypical  Patient is to be discharged with recommendation to follow up with PCP in regards to today's hospital visit. Chest pain is not likely of cardiac or pulmonary etiology d/t presentation, perc negative, VSS, no tracheal deviation, no JVD or new murmur, RRR, breath sounds equal bilaterally, EKG without acute abnormalities, negative troponin, and negative CXR. Pt has been advised start a  PPI and return to the ED is CP becomes exertional, associated with diaphoresis or nausea, radiates to left jaw/arm, worsens or becomes concerning in any way. Pt appears reliable for follow up and is agreeable to discharge. Discussed pts risk factors for MI and lifestyle changes as well.            Jaci Carrel, [place] 03/28/12 [phone]

## 2012-03-29 ENCOUNTER — Emergency Department (HOSPITAL_COMMUNITY)
Admission: EM | Admit: 2012-03-29 | Discharge: 2012-03-29 | Disposition: A | Discharge status: Home / Self Care | Payer: Self-pay | Attending: Emergency Medicine | Admitting: Emergency Medicine

## 2012-03-29 ENCOUNTER — Encounter (HOSPITAL_COMMUNITY): Payer: Self-pay | Admitting: Emergency Medicine

## 2012-03-29 ENCOUNTER — Emergency Department (HOSPITAL_COMMUNITY): Payer: Self-pay

## 2012-03-29 DIAGNOSIS — Z79899 Other long term (current) drug therapy: Secondary | ICD-10-CM | POA: Insufficient documentation

## 2012-03-29 DIAGNOSIS — R059 Cough, unspecified: Secondary | ICD-10-CM | POA: Insufficient documentation

## 2012-03-29 DIAGNOSIS — R05 Cough: Secondary | ICD-10-CM

## 2012-03-29 DIAGNOSIS — E119 Type 2 diabetes mellitus without complications: Secondary | ICD-10-CM | POA: Insufficient documentation

## 2012-03-29 MED ORDER — HYDROCODONE-ACETAMINOPHEN 5-325 MG PO TABS: 2.0000 | ORAL_TABLET | ORAL | Status: DC | PRN

## 2012-03-29 MED ORDER — OSELTAMIVIR PHOSPHATE 75 MG PO CAPS: 75.0000 mg | ORAL_CAPSULE | Freq: Two times a day (BID) | ORAL | Status: DC

## 2012-03-29 MED ORDER — AZITHROMYCIN 250 MG PO TABS: 250.0000 mg | ORAL_TABLET | Freq: Every day | ORAL | Status: DC

## 2012-03-29 MED ORDER — HYDROCODONE-ACETAMINOPHEN 5-325 MG PO TABS
1.0000 | ORAL_TABLET | Freq: Once | ORAL | Status: AC
  Administered 2012-03-29: 1 via ORAL
  Filled 2012-03-29: qty 1

## 2012-03-29 NOTE — ED Notes (Signed)
PT. REPORTS PERSISTENT DRY COUGH WITH CHEST CONGESTION ONSET YESTERDAY . SLIGHT NASAL CONGESTION .

## 2012-03-29 NOTE — ED Provider Notes (Signed)
History     CSN: [id number]  Arrival date & time 03/29/12  0247   First MD Initiated Contact with Patient 03/29/12 0248      Chief Complaint  Patient presents with  . Cough    (Consider location/radiation/quality/duration/timing/severity/associated sxs/prior treatment) Patient is a 37 y.o. male presenting with cough. The history is provided by the patient.  Cough This is a new problem. The current episode started yesterday. The problem occurs constantly. The problem has been gradually worsening. The cough is non-productive. He is not a smoker.    Past Medical History  Diagnosis Date  . Diabetes mellitus     Past Surgical History  Procedure Date  . Appendectomy     Family History  Problem Relation Age of Onset  . Diabetes Other   . Cancer Other     History  Substance Use Topics  . Smoking status: Never Smoker   . Smokeless tobacco: Not on file  . Alcohol Use: No      Review of Systems  Respiratory: Positive for cough.   All other systems reviewed and are negative.    Allergies  Review of patient's allergies indicates no known allergies.  Home Medications   Current Outpatient Rx  Name  Route  Sig  Dispense  Refill  . METFORMIN HCL 500 MG PO TABS   Oral   Take 500 mg by mouth daily with breakfast.         . OXYCODONE-ACETAMINOPHEN 10-325 MG PO TABS   Oral   Take 1 tablet by mouth daily as needed. For pain           BP 127/66  Pulse 98  Temp 98.3 F (36.8 C) (Oral)  Resp 16  SpO2 97%  Physical Exam  Constitutional: He is oriented to person, place, and time. He appears well-developed and well-nourished.  HENT:  Head: Normocephalic and atraumatic.  Eyes: Conjunctivae normal are normal. Pupils are equal, round, and reactive to light.  Neck: Normal range of motion. Neck supple.  Cardiovascular: Normal rate, regular rhythm, normal heart sounds and intact distal pulses.   Pulmonary/Chest: Effort normal and breath sounds normal.  Abdominal:  Soft. Bowel sounds are normal.  Neurological: He is alert and oriented to person, place, and time.  Skin: Skin is warm and dry.  Psychiatric: He has a normal mood and affect. His behavior is normal. Judgment and thought content normal.    ED Course  Procedures (including critical care time)  Labs Reviewed - No data to display Dg Chest 2 View  03/27/2012  *RADIOLOGY REPORT*  Clinical Data: Chest pain and congestion for 1 month.  CHEST - 2 VIEW  Comparison: 09/12/2011  Findings: The heart size and pulmonary vascularity are normal. The lungs appear clear and expanded without focal air space disease or consolidation. No blunting of the costophrenic angles.  No pneumothorax.  Mediastinal contours appear intact.  No significant change since previous study.  IMPRESSION: No evidence of active pulmonary disease.   Original Report Authenticated By: Burman Nieves, M.D.    Dg Chest Port 1 View  03/29/2012  *RADIOLOGY REPORT*  Clinical Data: Cough and shortness of breath.  PORTABLE CHEST - 1 VIEW  Comparison: 03/27/2012  Findings: Shallow inspiration with elevation of the left hemidiaphragm. The heart size and pulmonary vascularity are normal. The lungs appear clear and expanded without focal air space disease or consolidation. No blunting of the costophrenic angles.  No pneumothorax.  Mediastinal contours appear intact.  No significant change since  previous study.  IMPRESSION: No evidence of active pulmonary disease.   Original Report Authenticated By: Burman Nieves, M.D.      No diagnosis found.    MDM  + cough.  cxr neg.  Will dc with ibr, tamiflu, analgesia,  Ret new/worsening sxs        Gregor Dershem Lytle Michaels, MD 03/29/12 [phone]

## 2012-10-02 ENCOUNTER — Emergency Department (HOSPITAL_COMMUNITY): Payer: Self-pay

## 2012-10-02 ENCOUNTER — Emergency Department (HOSPITAL_COMMUNITY)
Admission: EM | Admit: 2012-10-02 | Discharge: 2012-10-02 | Disposition: A | Discharge status: Home / Self Care | Payer: Self-pay | Attending: Emergency Medicine | Admitting: Emergency Medicine

## 2012-10-02 ENCOUNTER — Encounter (HOSPITAL_COMMUNITY): Payer: Self-pay | Admitting: Emergency Medicine

## 2012-10-02 DIAGNOSIS — R059 Cough, unspecified: Secondary | ICD-10-CM | POA: Insufficient documentation

## 2012-10-02 DIAGNOSIS — G8929 Other chronic pain: Secondary | ICD-10-CM | POA: Insufficient documentation

## 2012-10-02 DIAGNOSIS — R52 Pain, unspecified: Secondary | ICD-10-CM | POA: Insufficient documentation

## 2012-10-02 DIAGNOSIS — R05 Cough: Secondary | ICD-10-CM | POA: Insufficient documentation

## 2012-10-02 DIAGNOSIS — E119 Type 2 diabetes mellitus without complications: Secondary | ICD-10-CM | POA: Insufficient documentation

## 2012-10-02 DIAGNOSIS — R071 Chest pain on breathing: Secondary | ICD-10-CM | POA: Insufficient documentation

## 2012-10-02 DIAGNOSIS — E669 Obesity, unspecified: Secondary | ICD-10-CM | POA: Insufficient documentation

## 2012-10-02 DIAGNOSIS — R0789 Other chest pain: Secondary | ICD-10-CM

## 2012-10-02 DIAGNOSIS — M549 Dorsalgia, unspecified: Secondary | ICD-10-CM | POA: Insufficient documentation

## 2012-10-02 HISTORY — DX: Dorsalgia, unspecified: M54.9

## 2012-10-02 MED ORDER — CYCLOBENZAPRINE HCL 10 MG PO TABS: 10.0000 mg | ORAL_TABLET | Freq: Three times a day (TID) | ORAL | Status: DC | PRN

## 2012-10-02 NOTE — ED Provider Notes (Signed)
History    CSN: [id number] Arrival date & time 10/02/12  0033  First MD Initiated Contact with Patient 10/02/12 0040     Chief Complaint  Patient presents with  . Cough  . chest congestion    (Consider location/radiation/quality/duration/timing/severity/associated sxs/prior Treatment) HPI  Dale Reed is a 37 y.o. male with past medical history significant for non-insulin-dependent diabetes 17, 8/10 intermittent chest pain exacerbated by certain positions specifically landing on his right side and supine worsening over the course last 3 weeks. Patient denies trauma, strenuous exercise, productive cough, fever, chills, shortness of breath, recent immobilizations, history of DVT or PE, abdominal pain, nausea vomiting, change in bowel or bladder habits. Patient states he is always lifting for work. He is taken Percocet for his chronic back pain with little relief, has also taken aspirin again with no relief. Patient also has a bony overgrowth in the left ulnar styloid that he would like evaluated. He denies pain, trauma, numbness, paresthesia, patient is right-hand dominant.  Past Medical History  Diagnosis Date  . Diabetes mellitus    Past Surgical History  Procedure Laterality Date  . Appendectomy     Family History  Problem Relation Age of Onset  . Diabetes Other   . Cancer Other    History  Substance Use Topics  . Smoking status: Never Smoker   . Smokeless tobacco: Not on file  . Alcohol Use: No    Review of Systems  Constitutional:       Negative except as described in HPI  HENT:       Negative except as described in HPI  Respiratory:       Negative except as described in HPI  Cardiovascular:       Negative except as described in HPI  Gastrointestinal:       Negative except as described in HPI  Genitourinary:       Negative except as described in HPI  Musculoskeletal:       Negative except as described in HPI  Skin:       Negative except as described in HPI   Neurological:       Negative except as described in HPI  All other systems reviewed and are negative.    Allergies  Review of patient's allergies indicates no known allergies.  Home Medications   Current Outpatient Rx  Name  Route  Sig  Dispense  Refill  . azithromycin (ZITHROMAX) 250 MG tablet   Oral   Take 1 tablet (250 mg total) by mouth daily. Take first 2 tablets together, then 1 every day until finished.   6 tablet   0   . Diphenhydramine-PE-APAP (BENADRYL ALLERGY/COLD) 12.5-5-325 MG TABS   Oral   Take 1-2 tablets by mouth every 6 (six) hours as needed. Cold and flue         . HYDROcodone-acetaminophen (NORCO/VICODIN) 5-325 MG per tablet   Oral   Take 2 tablets by mouth every 4 (four) hours as needed for pain.   6 tablet   0   . metFORMIN (GLUCOPHAGE) 500 MG tablet   Oral   Take 500 mg by mouth daily with breakfast.         . oseltamivir (TAMIFLU) 75 MG capsule   Oral   Take 1 capsule (75 mg total) by mouth every 12 (twelve) hours.   10 capsule   0   . oxyCODONE-acetaminophen (PERCOCET) 10-325 MG per tablet   Oral   Take 1 tablet by mouth  daily as needed. For back pain          There were no vitals taken for this visit. Physical Exam  Nursing note and vitals reviewed. Constitutional: He is oriented to person, place, and time. He appears well-developed and well-nourished. No distress.  Obese  HENT:  Head: Normocephalic.  Mouth/Throat: Oropharynx is clear and moist. No oropharyngeal exudate.  Eyes: Conjunctivae and EOM are normal. Pupils are equal, round, and reactive to light.  Cardiovascular: Normal rate, regular rhythm and intact distal pulses.   Pulmonary/Chest: Effort normal and breath sounds normal. No stridor. No respiratory distress. He has no wheezes. He has no rales. He exhibits tenderness.  Chest pain is reproducible to palpation specifically in the right lower anterior part of his chest.  Abdominal: Soft. Bowel sounds are normal. He  exhibits no distension and no mass. There is no tenderness. There is no rebound and no guarding.  Musculoskeletal: Normal range of motion. He exhibits no edema.  No calf asymmetry, superficial collaterals, palpable cords, edema, Homans sign negative bilaterally.   The ulnar styloid process a slightly enlarged on the left versus the right, he is neurovascularly intact there is no signs of warmth very minimally tender to palpation. Excellent range of motion   Neurological: He is alert and oriented to person, place, and time.  Psychiatric: He has a normal mood and affect.    ED Course  Procedures (including critical care time) Labs Reviewed - No data to display Dg Chest 2 View  10/02/2012   *RADIOLOGY REPORT*  Clinical Data: Intermittent chest pain.  CHEST - 2 VIEW  Comparison: One-view chest 03/29/2012.  Findings: The heart size is normal.  The lungs are clear.  The visualized soft tissues and bony thorax are unremarkable.  IMPRESSION: Negative two-view chest x-ray.   Original Report Authenticated By: Marin Roberts, M.D.   1. Acute chest wall pain     MDM   Filed Vitals:   10/02/12 0044  BP: 109/66  Pulse: 72  Temp: 98.4 F (36.9 C)  TempSrc: Oral  Resp: 14  Weight: 265 lb (120.203 kg)  SpO2: 100%     Dale Reed is a 37 y.o. male with diffuse aching chest pain worsening over the course of 3 weeks reproducible to palpation. Chest x-ray shows no abnormalities. Patient has no risk factors for DVT or PE. Low risk by Wells criteria and PERC negative. Likely costochondritis or muscle strain.  Mild enlargement of the left ulnar styloid process with no signs of infection. I will refer him to an orthopedist for further evaluation.  Pt is hemodynamically stable, appropriate for, and amenable to discharge at this time. Pt verbalized understanding and agrees with care plan. Outpatient follow-up and specific return precautions discussed.    Discharge Medication List as of 10/02/2012   1:35 AM    START taking these medications   Details  cyclobenzaprine (FLEXERIL) 10 MG tablet Take 1 tablet (10 mg total) by mouth 3 (three) times daily as needed for muscle spasms., Starting 10/02/2012, Until Discontinued, [employer], PA-C 10/02/12 [phone]

## 2012-10-02 NOTE — ED Notes (Signed)
Patient states that he has had chest pain x 2 -3 weeks. The patient states that his chest hurts worse when lying on his side. The patient also reports that he has a cough for the same amount of time.

## 2012-10-02 NOTE — ED Provider Notes (Signed)
I have reviewed the report and personally reviewed the above radiology studies.    Hilario Quarry, MD 10/02/12 [phone]

## 2012-11-27 ENCOUNTER — Other Ambulatory Visit (HOSPITAL_COMMUNITY): Payer: Self-pay | Admitting: Internal Medicine

## 2012-11-27 ENCOUNTER — Ambulatory Visit (HOSPITAL_COMMUNITY)
Admission: RE | Admit: 2012-11-27 | Discharge: 2012-11-27 | Disposition: A | Discharge status: Home / Self Care | Payer: Self-pay | Source: Ambulatory Visit | Attending: Internal Medicine | Admitting: Internal Medicine

## 2012-11-27 DIAGNOSIS — R52 Pain, unspecified: Secondary | ICD-10-CM

## 2012-11-27 DIAGNOSIS — M25539 Pain in unspecified wrist: Secondary | ICD-10-CM | POA: Insufficient documentation

## 2012-11-27 DIAGNOSIS — M7989 Other specified soft tissue disorders: Secondary | ICD-10-CM | POA: Insufficient documentation

## 2013-07-30 ENCOUNTER — Encounter (HOSPITAL_COMMUNITY): Payer: Self-pay | Admitting: Emergency Medicine

## 2013-07-30 ENCOUNTER — Emergency Department (HOSPITAL_COMMUNITY)
Admission: EM | Admit: 2013-07-30 | Discharge: 2013-07-30 | Disposition: A | Discharge status: Home / Self Care | Payer: Self-pay | Attending: Emergency Medicine | Admitting: Emergency Medicine

## 2013-07-30 ENCOUNTER — Emergency Department (HOSPITAL_COMMUNITY): Payer: Self-pay

## 2013-07-30 DIAGNOSIS — Z79899 Other long term (current) drug therapy: Secondary | ICD-10-CM | POA: Insufficient documentation

## 2013-07-30 DIAGNOSIS — R0602 Shortness of breath: Secondary | ICD-10-CM | POA: Insufficient documentation

## 2013-07-30 DIAGNOSIS — M542 Cervicalgia: Secondary | ICD-10-CM | POA: Insufficient documentation

## 2013-07-30 DIAGNOSIS — M25519 Pain in unspecified shoulder: Secondary | ICD-10-CM | POA: Insufficient documentation

## 2013-07-30 DIAGNOSIS — M25512 Pain in left shoulder: Secondary | ICD-10-CM

## 2013-07-30 DIAGNOSIS — E119 Type 2 diabetes mellitus without complications: Secondary | ICD-10-CM | POA: Insufficient documentation

## 2013-07-30 MED ORDER — CYCLOBENZAPRINE HCL 10 MG PO TABS: 10.0000 mg | ORAL_TABLET | Freq: Two times a day (BID) | ORAL | Status: DC | PRN

## 2013-07-30 MED ORDER — KETOROLAC TROMETHAMINE 30 MG/ML IJ SOLN
30.0000 mg | Freq: Once | INTRAMUSCULAR | Status: AC
  Administered 2013-07-30: 30 mg via INTRAMUSCULAR
  Filled 2013-07-30: qty 1

## 2013-07-30 MED ORDER — NAPROXEN 500 MG PO TABS: 500.0000 mg | ORAL_TABLET | Freq: Two times a day (BID) | ORAL | Status: DC

## 2013-07-30 MED ORDER — DIAZEPAM 5 MG PO TABS
5.0000 mg | ORAL_TABLET | Freq: Once | ORAL | Status: AC
  Administered 2013-07-30: 5 mg via ORAL
  Filled 2013-07-30: qty 1

## 2013-07-30 NOTE — ED Notes (Signed)
Pt informed of side effects of medication and that he is not to drive while taking medication. Pt informed of need to call a ride and reported he would call his mother to get him.

## 2013-07-30 NOTE — ED Notes (Signed)
Pt states he has had shoulder pain for the past 6 days  Pt states it has moved down his arm and into his back  Pt states yesterday about 5pm he started having chest pain in the left side of his chest  Pt states the pain is constant but varies in intensity  Pt denies any other sxs but states he felt a little short of breath on the way here

## 2013-07-30 NOTE — Discharge Instructions (Signed)
Take Naprosyn as needed for inflammation. Take Flexeril as needed for muscle spasm. Refer to attached documents for more information. Follow up with your doctor for further evaluation. Return to the ED with worsening or concerning symptoms.

## 2013-07-30 NOTE — ED Provider Notes (Signed)
6:13 AM Patient signed out to me by Earley FavorGail Schulz, NP. Patient is bending xrays of chest and cervical spine. Patient given IM toradol and valium for pain.   7:12 AM Patient's xrays unremarkable for acute changes. Patient likely having muscle pain or inflammation causing pain due to repetitive left shoulder movement. Patient reports improvement of pain. Patient will be discharged with anti-inflammatory medication and muscle relaxer. Patient instructed to follow up with PCP and return to the ED with worsening or concerning symptoms.   Dale BeckKaitlyn Nylia Gavina, PA-C 07/30/13 [phone]

## 2013-07-30 NOTE — ED Provider Notes (Signed)
Medical screening examination/treatment/procedure(s) were performed by non-physician practitioner and as supervising physician I was immediately available for consultation/collaboration.   EKG Interpretation   Date/Time:  Monday Jul 30 2013 04:22:07 EDT Ventricular Rate:  78 PR Interval:  135 QRS Duration: 106 QT Interval:  372 QTC Calculation: 424 R Axis:   -61 Text Interpretation:  Sinus rhythm Markedly posterior QRS axis Low  voltage, precordial leads SImilar to prior Confirmed by HORTON  MD,  COURTNEY ([id number]) on 07/30/2013 4:46:14 AM        Shon Batonourtney F Horton, MD 07/30/13 [phone]

## 2013-07-30 NOTE — ED Notes (Signed)
Pt mother is at bedside

## 2013-07-30 NOTE — ED Provider Notes (Signed)
Medical screening examination/treatment/procedure(s) were performed by non-physician practitioner and as supervising physician I was immediately available for consultation/collaboration.   EKG Interpretation   Date/Time:  Monday Jul 30 2013 04:22:07 EDT Ventricular Rate:  78 PR Interval:  135 QRS Duration: 106 QT Interval:  372 QTC Calculation: 424 R Axis:   -61 Text Interpretation:  Sinus rhythm Markedly posterior QRS axis Low  voltage, precordial leads SImilar to prior Confirmed by Aaleeyah Bias  MD,  Mykhia Danish ([id number]) on 07/30/2013 4:46:14 AM       Shon Batonourtney F Araya Roel, MD 07/30/13 2319

## 2013-07-30 NOTE — ED Provider Notes (Signed)
CSN: [id number]     Arrival date & time 07/30/13  0415 History   First MD Initiated Contact with Patient 07/30/13 [phone]     Chief Complaint  Patient presents with  . 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  He is right-hand dominant.  His description of his jobs is that he hold the tire in his left hand and wipes.  The rim of the tire with moisture to give it.  Traction therefore, go carts that made numerous runs over a 10-12 hour period. He, states, that the pain has been persistent for the past 5 days, but worsening over the past 24 hours.  Now the pain radiates into his anterior right chest wall and down into his left anterior rib area.  He, states he has taken ibuprofen, without any relief.  He also states, that he has pain in the back of his neck at the thoracic/ cervical junction.  This morning with worsening pain.  He stated he felt slightly short of breath.  Denies any recent fever, lung disease, and is not a smoker. He tried taking Advil for discomfort, without any relief.  He also has a prescription for Percocet, which he, states gives him no relief  Patient is a 38 y.o. male presenting with chest pain. The history is provided by the patient.  Chest Pain Pain location:  R chest Pain quality: aching   Pain radiates to the back: no   Pain severity:  Moderate Onset quality:  Gradual Duration:  6 days Timing:  Constant Progression:  Worsening Chronicity:  New Context: not eating   Relieved by:  Nothing Worsened by:  Certain positions Ineffective treatments: Ibuprofen. Associated symptoms: shortness of breath   Associated symptoms: no dizziness, no fever, no numbness and no palpitations     Past Medical History  Diagnosis  Date  . Diabetes mellitus   . Back pain    Past Surgical History  Procedure Laterality Date  . Appendectomy     Family History  Problem Relation Age of Onset  . Diabetes Other   . Cancer Other   . Hypertension Other    History  Substance Use Topics  . Smoking status: Never Smoker   . Smokeless tobacco: Not on file  . Alcohol Use: No    Review of Systems  Constitutional: Negative for fever and chills.  Eyes: Negative for visual disturbance.  Respiratory: Positive for shortness of breath.   Cardiovascular: Positive for chest pain. Negative for palpitations.  Musculoskeletal: Positive for neck pain. Negative for neck stiffness.  Skin: Negative for rash.  Neurological: Negative for dizziness and numbness.  All other systems reviewed and are negative.     Allergies  Review of patient's allergies indicates no known allergies.  Home Medications   Prior to Admission medications   Medication Sig Start Date End Date Taking? Authorizing Provider  cyclobenzaprine (FLEXERIL) 10 MG tablet Take 1 tablet (10 mg total) by mouth 3 (three) times daily as needed for muscle spasms. 10/02/12  Yes Nicole Pisciotta, PA-C  metFORMIN (GLUCOPHAGE) 500 MG tablet Take 500 mg by mouth daily with breakfast.   Yes Historical Provider, MD  oxyCODONE-acetaminophen (PERCOCET) 10-325 MG per tablet Take 1 tablet by mouth daily as needed. For back pain   Yes  Historical Provider, MD   BP 133/74  Pulse 75  Temp(Src) 98 F (36.7 C) (Oral)  Resp 18  Ht 5\' 9"  (1.753 m)  Wt 262 lb (118.842 kg)  BMI 38.67 kg/m2  SpO2 97% Physical Exam  Nursing note and vitals reviewed. Constitutional: He is oriented to person, place, and time. He appears well-developed and well-nourished. No distress.  HENT:  Head: Normocephalic.  Mouth/Throat: Oropharynx is clear and moist.  Eyes: Pupils are equal, round, and reactive to light.  Neck: Normal range of motion. Muscular tenderness present. No spinous process tenderness  present.    Cardiovascular: Normal rate and regular rhythm.   Pulmonary/Chest: Effort normal and breath sounds normal. No respiratory distress. He exhibits tenderness. He exhibits no crepitus, no deformity and no swelling.    Abdominal: Soft. He exhibits no distension. There is no tenderness.  Musculoskeletal: He exhibits tenderness. He exhibits no edema.  Lymphadenopathy:    He has no cervical adenopathy.  Neurological: He is alert and oriented to person, place, and time.  Skin: Skin is warm. No rash noted. No erythema.    ED Course  Procedures (including critical care time) Labs Review Labs Reviewed - No data to display  Imaging Review Dg Chest 2 View  07/30/2013   CLINICAL DATA:  Pain in the right shoulder for 5 days. Shooting pain across the chest.  EXAM: CHEST  2 VIEW  COMPARISON:  Chest x-ray 10/02/2012.  FINDINGS: Lung volumes are normal. No consolidative airspace disease. No pleural effusions. No pneumothorax. No pulmonary nodule or mass noted. Pulmonary vasculature and the cardiomediastinal silhouette are within normal limits.  IMPRESSION: No radiographic evidence of acute cardiopulmonary disease.   Electronically Signed   By: Trudie Reedaniel  Entrikin M.D.   On: 07/30/2013 06:13     EKG Interpretation   Date/Time:  Monday Jul 30 2013 04:22:07 EDT Ventricular Rate:  78 PR Interval:  135 QRS Duration: 106 QT Interval:  372 QTC Calculation: 424 R Axis:   -61 Text Interpretation:  Sinus rhythm Markedly posterior QRS axis Low  voltage, precordial leads SImilar to prior Confirmed by HORTON  MD,  COURTNEY ([id number]) on 07/30/2013 4:46:14 AM      MDM  Vision, given IM Toradol, and by mouth Valium, we'll x-ray chest and neck, although I doubt there will be any pathology to explain his symptoms.  Plan to discharge home with anti-inflammatory and muscle relaxer Final diagnoses:  None         Arman FilterGail K Khushi Zupko, NP 07/30/13 [phone]

## 2014-07-07 ENCOUNTER — Emergency Department (HOSPITAL_COMMUNITY): Payer: Self-pay

## 2014-07-07 ENCOUNTER — Encounter (HOSPITAL_COMMUNITY): Payer: Self-pay | Admitting: Emergency Medicine

## 2014-07-07 ENCOUNTER — Emergency Department (HOSPITAL_COMMUNITY)
Admission: EM | Admit: 2014-07-07 | Discharge: 2014-07-08 | Disposition: A | Discharge status: Home / Self Care | Payer: Self-pay | Attending: Emergency Medicine | Admitting: Emergency Medicine

## 2014-07-07 DIAGNOSIS — R739 Hyperglycemia, unspecified: Secondary | ICD-10-CM

## 2014-07-07 DIAGNOSIS — Z79899 Other long term (current) drug therapy: Secondary | ICD-10-CM | POA: Insufficient documentation

## 2014-07-07 DIAGNOSIS — E669 Obesity, unspecified: Secondary | ICD-10-CM | POA: Insufficient documentation

## 2014-07-07 DIAGNOSIS — R079 Chest pain, unspecified: Secondary | ICD-10-CM | POA: Insufficient documentation

## 2014-07-07 DIAGNOSIS — Z791 Long term (current) use of non-steroidal anti-inflammatories (NSAID): Secondary | ICD-10-CM | POA: Insufficient documentation

## 2014-07-07 DIAGNOSIS — E1165 Type 2 diabetes mellitus with hyperglycemia: Secondary | ICD-10-CM | POA: Insufficient documentation

## 2014-07-07 LAB — BASIC METABOLIC PANEL
ANION GAP: 11 (ref 5–15)
BUN: 14 mg/dL (ref 6–23)
CO2: 22 mmol/L (ref 19–32)
Calcium: 9.5 mg/dL (ref 8.4–10.5)
Chloride: 104 mmol/L (ref 96–112)
Creatinine, Ser: 0.78 mg/dL (ref 0.50–1.35)
GFR calc Af Amer: >90 mL/min (ref >90)
GFR calc non Af Amer: >90 mL/min (ref >90)
Glucose, Bld: 441 mg/dL — ABNORMAL HIGH (ref 70–99)
Potassium: 4 mmol/L (ref 3.5–5.1)
Sodium: 137 mmol/L (ref 135–145)

## 2014-07-07 LAB — CBC
HCT: 45.6 % (ref 39.0–52.0)
HEMOGLOBIN: 16.6 g/dL (ref 13.0–17.0)
MCH: 30.2 pg (ref 26.0–34.0)
MCHC: 36.4 g/dL — AB (ref 30.0–36.0)
MCV: 83.1 fL (ref 78.0–100.0)
PLATELETS: 270 10*3/uL (ref 150–400)
RBC: 5.49 MIL/uL (ref 4.22–5.81)
RDW: 12.7 % (ref 11.5–15.5)
WBC: 6.9 10*3/uL (ref 4.0–10.5)

## 2014-07-07 LAB — I-STAT TROPONIN, ED: TROPONIN I, POC: 0.01 ng/mL (ref 0.00–0.08)

## 2014-07-07 NOTE — ED Notes (Signed)
Pt reports intermittent left sided chest pain for a couple of weeks. Pt rates pain 8/10. Pt reports being lightheaded at times.

## 2014-07-08 LAB — CBG MONITORING, ED: Glucose-Capillary: 301 mg/dL — ABNORMAL HIGH (ref 70–99)

## 2014-07-08 MED ORDER — SODIUM CHLORIDE 0.9 % IV SOLN
1000.0000 mL | Freq: Once | INTRAVENOUS | Status: AC
  Administered 2014-07-08: 1000 mL via INTRAVENOUS

## 2014-07-08 MED ORDER — SODIUM CHLORIDE 0.9 % IV SOLN
1000.0000 mL | INTRAVENOUS | Status: DC
  Administered 2014-07-08: 1000 mL via INTRAVENOUS

## 2014-07-08 NOTE — ED Notes (Signed)
Pt. On cardiac monitor. 

## 2014-07-08 NOTE — Discharge Instructions (Signed)
Your blood sugar was elevated, stay in the emergency department.  Is important for you to adhere to a diabetic diet.  Please check your blood sugar before each meal for the next week.  If you notice that your blood sugars are running high, you may need to increase your metformin to 1000 mg a day.  Contact your doctor on Monday for recheck of your elevated blood glucose.  Your workup for chest pain today did not show any current abnormalities, however, it is important for you to follow-up closely with a cardiologist and/or your primary care Dr. for further workup of this pain.  Use the numbers listed above for follow-up.  Return to the emergency department for worsening condition or new concerning symptoms.   Blood Glucose Monitoring Monitoring your blood glucose (also know as blood sugar) helps you to manage your diabetes. It also helps you and your health care provider monitor your diabetes and determine how well your treatment plan is working. WHY SHOULD YOU MONITOR YOUR BLOOD GLUCOSE?  It can help you understand how food, exercise, and medicine affect your blood glucose.  It allows you to know what your blood glucose is at any given moment. You can quickly tell if you are having low blood glucose (hypoglycemia) or high blood glucose (hyperglycemia).  It can help you and your health care provider know how to adjust your medicines.  It can help you understand how to manage an illness or adjust medicine for exercise. WHEN SHOULD YOU TEST? Your health care provider will help you decide how often you should check your blood glucose. This may depend on the type of diabetes you have, your diabetes control, or the types of medicines you are taking. Be sure to write down all of your blood glucose readings so that this information can be reviewed with your health care provider. See below for examples of testing times that your health care provider may suggest. Type 1 Diabetes  Test 4 times a day if you  are in good control, using an insulin pump, or perform multiple daily injections.  If your diabetes is not well controlled or if you are sick, you may need to monitor more often.  It is a good idea to also monitor:  Before and after exercise.  Between meals and 2 hours after a meal.  Occasionally between 2:00 a.m. and 3:00 a.m. Type 2 Diabetes  It can vary with each person, but generally, if you are on insulin, test 4 times a day.  If you take medicines by mouth (orally), test 2 times a day.  If you are on a controlled diet, test once a day.  If your diabetes is not well controlled or if you are sick, you may need to monitor more often. HOW TO MONITOR YOUR BLOOD GLUCOSE Supplies Needed  Blood glucose meter.  Test strips for your meter. Each meter has its own strips. You must use the strips that go with your own meter.  A pricking needle (lancet).  A device that holds the lancet (lancing device).  A journal or log book to write down your results. Procedure  Wash your hands with soap and water. Alcohol is not preferred.  Prick the side of your finger (not the tip) with the lancet.  Gently milk the finger until a small drop of blood appears.  Follow the instructions that come with your meter for inserting the test strip, applying blood to the strip, and using your blood glucose meter. Other Areas  to Get Blood for Testing Some meters allow you to use other areas of your body (other than your finger) to test your blood. These areas are called alternative sites. The most common alternative sites are:  The forearm.  The thigh.  The back area of the lower leg.  The palm of the hand. The blood flow in these areas is slower. Therefore, the blood glucose values you get may be delayed, and the numbers are different from what you would get from your fingers. Do not use alternative sites if you think you are having hypoglycemia. Your reading will not be accurate. Always use a  finger if you are having hypoglycemia. Also, if you cannot feel your lows (hypoglycemia unawareness), always use your fingers for your blood glucose checks. ADDITIONAL TIPS FOR GLUCOSE MONITORING  Do not reuse lancets.  Always carry your supplies with you.  All blood glucose meters have a 24-hour "hotline" number to call if you have questions or need help.  Adjust (calibrate) your blood glucose meter with a control solution after finishing a few boxes of strips. BLOOD GLUCOSE RECORD KEEPING It is a good idea to keep a daily record or log of your blood glucose readings. Most glucose meters, if not all, keep your glucose records stored in the meter. Some meters come with the ability to download your records to your home computer. Keeping a record of your blood glucose readings is especially helpful if you are wanting to look for patterns. Make notes to go along with the blood glucose readings because you might forget what happened at that exact time. Keeping good records helps you and your health care provider to work together to achieve good diabetes management.  Document Released: 03/04/2003 Document Revised: 07/16/2013 Document Reviewed: 07/24/2012 [facility] Patient Information 2015 [place], [place]. This information is not intended to replace advice given to you by your health care provider. Make sure you discuss any questions you have with your health care provider.  Chest Pain Observation It is often hard to give a specific diagnosis for the cause of chest pain. Among other possibilities your symptoms might be caused by inadequate oxygen delivery to your heart (angina). Angina that is not treated or evaluated can lead to a heart attack (myocardial infarction) or death. Blood tests, electrocardiograms, and X-rays may have been done to help determine a possible cause of your chest pain. After evaluation and observation, your health care provider has determined that it is unlikely your pain was  caused by an unstable condition that requires hospitalization. However, a full evaluation of your pain may need to be completed, with additional diagnostic testing as directed. It is very important to keep your follow-up appointments. Not keeping your follow-up appointments could result in permanent heart damage, disability, or death. If there is any problem keeping your follow-up appointments, you must call your health care provider. HOME CARE INSTRUCTIONS  Due to the slight chance that your pain could be angina, it is important to follow your health care provider's treatment plan and also maintain a healthy lifestyle:  Maintain or work toward achieving a healthy weight.  Stay physically active and exercise regularly.  Decrease your salt intake.  Eat a balanced, healthy diet. Talk to a dietitian to learn about heart-healthy foods.  Increase your fiber intake by including whole grains, vegetables, fruits, and nuts in your diet.  Avoid situations that cause stress, anger, or depression.  Take medicines as advised by your health care provider. Report any side effects to  your health care provider. Do not stop medicines or adjust the dosages on your own.  Quit smoking. Do not use nicotine patches or gum until you check with your health care provider.  Keep your blood pressure, blood sugar, and cholesterol levels within normal limits.  Limit alcohol intake to no more than 1 drink per day for women who are not pregnant and 2 drinks per day for men.  Do not abuse drugs. SEEK IMMEDIATE MEDICAL CARE IF: You have severe chest pain or pressure which may include symptoms such as:  You feel pain or pressure in your arms, neck, jaw, or back.  You have severe back or abdominal pain, feel sick to your stomach (nauseous), or throw up (vomit).  You are sweating profusely.  You are having a fast or irregular heartbeat.  You feel short of breath while at rest.  You notice increasing shortness of  breath during rest, sleep, or with activity.  You have chest pain that does not get better after rest or after taking your usual medicine.  You wake from sleep with chest pain.  You are unable to sleep because you cannot breathe.  You develop a frequent cough or you are coughing up blood.  You feel dizzy, faint, or experience extreme fatigue.  You develop severe weakness, dizziness, fainting, or chills. Any of these symptoms may represent a serious problem that is an emergency. Do not wait to see if the symptoms will go away. Call your local emergency services (911 in the [place] Do not drive yourself to the hospital. MAKE SURE YOU:  Understand these instructions.  Will watch your condition.  Will get help right away if you are not doing well or get worse. Document Released: 04/03/2010 Document Revised: 03/06/2013 Document Reviewed: 08/31/2012 [facility] Patient Information 2015 [place]. This information is not intended to replace advice given to you by your health care provider. Make sure you discuss any questions you have with your health care provider.  Hyperglycemia Hyperglycemia occurs when the glucose (sugar) in your blood is too high. Hyperglycemia can happen for many reasons, but it most often happens to people who do not know they have diabetes or are not managing their diabetes properly.  CAUSES  Whether you have diabetes or not, there are other causes of hyperglycemia. Hyperglycemia can occur when you have diabetes, but it can also occur in other situations that you might not be as aware of, such as: Diabetes  If you have diabetes and are having problems controlling your blood glucose, hyperglycemia could occur because of some of the following reasons:  Not following your meal plan.  Not taking your diabetes medications or not taking it properly.  Exercising less or doing less activity than you normally do.  Being sick. Pre-diabetes  This cannot be ignored.  Before people develop Type 2 diabetes, they almost always have "pre-diabetes." This is when your blood glucose levels are higher than normal, but not yet high enough to be diagnosed as diabetes. Research has shown that some long-term damage to the body, especially the heart and circulatory system, may already be occurring during pre-diabetes. If you take action to manage your blood glucose when you have pre-diabetes, you may delay or prevent Type 2 diabetes from developing. Stress  If you have diabetes, you may be "diet" controlled or on oral medications or insulin to control your diabetes. However, you may find that your blood glucose is higher than usual in the hospital whether you have diabetes or  not. This is often referred to as "stress hyperglycemia." Stress can elevate your blood glucose. This happens because of hormones put out by the body during times of stress. If stress has been the cause of your high blood glucose, it can be followed regularly by your caregiver. That way he/she can make sure your hyperglycemia does not continue to get worse or progress to diabetes. Steroids  Steroids are medications that act on the infection fighting system (immune system) to block inflammation or infection. One side effect can be a rise in blood glucose. Most people can produce enough extra insulin to allow for this rise, but for those who cannot, steroids make blood glucose levels go even higher. It is not unusual for steroid treatments to "uncover" diabetes that is developing. It is not always possible to determine if the hyperglycemia will go away after the steroids are stopped. A special blood test called an A1c is sometimes done to determine if your blood glucose was elevated before the steroids were started. SYMPTOMS  Thirsty.  Frequent urination.  Dry mouth.  Blurred vision.  Tired or fatigue.  Weakness.  Sleepy.  Tingling in feet or leg. DIAGNOSIS  Diagnosis is made by monitoring blood  glucose in one or all of the following ways:  A1c test. This is a chemical found in your blood.  Fingerstick blood glucose monitoring.  Laboratory results. TREATMENT  First, knowing the cause of the hyperglycemia is important before the hyperglycemia can be treated. Treatment may include, but is not be limited to:  Education.  Change or adjustment in medications.  Change or adjustment in meal plan.  Treatment for an illness, infection, etc.  More frequent blood glucose monitoring.  Change in exercise plan.  Decreasing or stopping steroids.  Lifestyle changes. HOME CARE INSTRUCTIONS   Test your blood glucose as directed.  Exercise regularly. Your caregiver will give you instructions about exercise. Pre-diabetes or diabetes which comes on with stress is helped by exercising.  Eat wholesome, balanced meals. Eat often and at regular, fixed times. Your caregiver or nutritionist will give you a meal plan to guide your sugar intake.  Being at an ideal weight is important. If needed, losing as little as 10 to 15 pounds may help improve blood glucose levels. SEEK MEDICAL CARE IF:   You have questions about medicine, activity, or diet.  You continue to have symptoms (problems such as increased thirst, urination, or weight gain). SEEK IMMEDIATE MEDICAL CARE IF:   You are vomiting or have diarrhea.  Your breath smells fruity.  You are breathing faster or slower.  You are very sleepy or incoherent.  You have numbness, tingling, or pain in your feet or hands.  You have chest pain.  Your symptoms get worse even though you have been following your caregiver's orders.  If you have any other questions or concerns. Document Released: 08/25/2000 Document Revised: 05/24/2011 Document Reviewed: 06/28/2011 [facility] Patient Information 2015 [place], [place]. This information is not intended to replace advice given to you by your health care provider. Make sure you discuss any  questions you have with your health care provider.

## 2014-07-08 NOTE — ED Provider Notes (Signed)
CSN: [id number]     Arrival date & time 07/07/14  2143 History   First MD Initiated Contact with Patient 07/08/14 0006     Chief Complaint  Patient presents with  . Chest Pain     (Consider location/radiation/quality/duration/timing/severity/associated sxs/prior Treatment) HPI 39 year old male presents to the emergency department from home with complaint of intermittent chest pain for the last 3-4 weeks.  Patient estimates that he has chest pain 1-2 times a week.  It is not prompted by activity or other known triggers.  Pain starts in the middle of his chest and spreads outward and sometimes goes down his left arm.  He reports pain is like a weight on his chest.  It usually lasts all day.  When it comes on, he does not get nauseated or short of breath.  Occasionally, he reports his face gets red, and also occasionally he will get dizzy.  Patient has history of hypertension.  He reports that his father died of small cell lung cancer, mother has history of diabetes and he believes that she has congestive heart failure.  He is not a smoker.  Patient came to the emergency department tonight because he saw a gentleman had a heart attack yesterday and it was a wakeup call to him.  Patient reports he has a primary care doctor.  Patient has history of diabetes, takes Glucophage once a day.  He reports on the weekends when he is working at the [employer].  He often does not stick to his diet.  He reports increased thirst and urination.  This weekend.  Blood sugar is noted be over 400.  Patient checks his blood sugar every few days.  He reports that normally 100-150.  Last checked his blood sugar on Thursday.  No prior workup for chest pain.  He does not currently have pain.  He reports mild pain earlier today lasting several hours  Past Medical History  Diagnosis Date  . Diabetes mellitus   . Back pain    Past Surgical History  Procedure Laterality Date  . Appendectomy     Family History  Problem  Relation Age of Onset  . Diabetes Other   . Cancer Other   . Hypertension Other    History  Substance Use Topics  . Smoking status: Never Smoker   . Smokeless tobacco: Not on file  . Alcohol Use: No    Review of Systems   See History of Present Illness; otherwise all other systems are reviewed and negative  Allergies  Review of patient's allergies indicates no known allergies.  Home Medications   Prior to Admission medications   Medication Sig Start Date End Date Taking? Authorizing Provider  metFORMIN (GLUCOPHAGE) 500 MG tablet Take 500 mg by mouth daily with breakfast.   Yes Historical Provider, MD  naproxen sodium (ANAPROX) 220 MG tablet Take 220 mg by mouth daily.   Yes Historical Provider, MD  oxyCODONE-acetaminophen (PERCOCET) 10-325 MG per tablet Take 1 tablet by mouth every 8 (eight) hours as needed. For back pain   Yes Historical Provider, MD  cyclobenzaprine (FLEXERIL) 10 MG tablet Take 1 tablet (10 mg total) by mouth 3 (three) times daily as needed for muscle spasms. Patient not taking: Reported on 07/07/2014 10/02/12   Joni Reining Pisciotta, PA-C  cyclobenzaprine (FLEXERIL) 10 MG tablet Take 1 tablet (10 mg total) by mouth 2 (two) times daily as needed for muscle spasms. Patient not taking: Reported on 07/07/2014 07/30/13   Emilia Beck, PA-C  naproxen (  NAPROSYN) 500 MG tablet Take 1 tablet (500 mg total) by mouth 2 (two) times daily with a meal. Patient not taking: Reported on 07/07/2014 07/30/13   Kaitlyn Szekalski, PA-C   BP 110/64 mmHg  Pulse 68  Temp(Src) 97.9 F (36.6 C) (Oral)  Resp 22  SpO2 99% Physical Exam  Constitutional: He is oriented to person, place, and time. He appears well-developed and well-nourished.  Obese male, no acute distress  HENT:  Head: Normocephalic and atraumatic.  Nose: Nose normal.  Mouth/Throat: Oropharynx is clear and moist.  Eyes: Conjunctivae and EOM are normal. Pupils are equal, round, and reactive to light.  Neck: Normal range  of motion. Neck supple. No JVD present. No tracheal deviation present. No thyromegaly present.  Cardiovascular: Normal rate, regular rhythm, normal heart sounds and intact distal pulses.  Exam reveals no gallop and no friction rub.   No murmur heard. Pulmonary/Chest: Effort normal and breath sounds normal. No stridor. No respiratory distress. He has no wheezes. He has no rales. He exhibits no tenderness.  Abdominal: Soft. Bowel sounds are normal. He exhibits no distension and no mass. There is no tenderness. There is no rebound and no guarding.  Musculoskeletal: Normal range of motion. He exhibits no edema or tenderness.  Lymphadenopathy:    He has no cervical adenopathy.  Neurological: He is alert and oriented to person, place, and time. He displays normal reflexes. He exhibits normal muscle tone. Coordination normal.  Skin: Skin is warm and dry. No rash noted. No erythema. No pallor.  Psychiatric: He has a normal mood and affect. His behavior is normal. Judgment and thought content normal.  Nursing note and vitals reviewed.   ED Course  Procedures (including critical care time) Labs Review Labs Reviewed  CBC - Abnormal; Notable for the following:    MCHC 36.4 (*)    All other components within normal limits  BASIC METABOLIC PANEL - Abnormal; Notable for the following:    Glucose, Bld 441 (*)    All other components within normal limits  CBG MONITORING, ED - Abnormal; Notable for the following:    Glucose-Capillary 301 (*)    All other components within normal limits  I-STAT TROPOININ, ED    Imaging Review Dg Chest 2 View  07/07/2014   CLINICAL DATA:  Left-sided chest pain radiating down left arm.  EXAM: CHEST  2 VIEW  COMPARISON:  Prior study from 07/30/2013  FINDINGS: The cardiac and mediastinal silhouettes are stable in size and contour, and remain within normal limits.  The lungs are normally inflated. No airspace consolidation, pleural effusion, or pulmonary edema is identified.  There is no pneumothorax.  No acute osseous abnormality identified.  IMPRESSION: No active cardiopulmonary disease.   Electronically Signed   By: Rise MuBenjamin  McClintock M.D.   On: 07/07/2014 23:46     EKG Interpretation   Date/Time:  Sunday July 07 2014 21:58:56 EDT Ventricular Rate:  70 PR Interval:  132 QRS Duration: 109 QT Interval:  386 QTC Calculation: 416 R Axis:   42 Text Interpretation:  Sinus rhythm Low voltage, precordial leads Minimal  ST elevation, inferior leads Confirmed by Annalyssa Thune  MD, Marquee Fuchs ([id number]) on  07/08/2014 12:00:32 AM      MDM   Final diagnoses:  Hyperglycemia  Chest pain with moderate risk for cardiac etiology    39 year old male with history of diabetes who is hyperglycemic today who is had intermittent chest pain over the last month.  Patient was prompted to come to the ER  due to seeing a bystander have a heart attack.  Patient has a negative troponin, EKG without ST elevation.  I feel that he needs a workup that this can be done as an outpatient as he is not having active chest pain or concerning EKG.  Patient does have hyperglycemia, admits to dietary indiscretions.  Will give IV fluids here, recheck sugar, refer to cardiology.   Marisa Severin, MD 07/08/14 [phone]

## 2014-10-01 ENCOUNTER — Emergency Department (INDEPENDENT_AMBULATORY_CARE_PROVIDER_SITE_OTHER)
Admission: EM | Admit: 2014-10-01 | Discharge: 2014-10-01 | Disposition: A | Discharge status: Home / Self Care | Payer: Self-pay | Source: Home / Self Care | Attending: Emergency Medicine | Admitting: Emergency Medicine

## 2014-10-01 DIAGNOSIS — R5383 Other fatigue: Secondary | ICD-10-CM

## 2014-10-01 DIAGNOSIS — T679XXA Effect of heat and light, unspecified, initial encounter: Secondary | ICD-10-CM

## 2014-10-01 DIAGNOSIS — B349 Viral infection, unspecified: Secondary | ICD-10-CM

## 2014-10-01 NOTE — ED Notes (Addendum)
Pt comes in with c/o feeling fatigue, scratchy throat and generalized body aches for 1 week States, coworker was sick with same sx's Denies nausea,vomitting, chills or fevers Hx Diabetes, CBG- 123

## 2014-10-01 NOTE — ED Provider Notes (Signed)
CSN: [id number]     Arrival date & time 10/01/14  1356 History   First MD Initiated Contact with Patient 10/01/14 1437     Chief Complaint  Patient presents with  . Fatigue   (Consider location/radiation/quality/duration/timing/severity/associated sxs/prior Treatment) HPI Comments: 39 year old male who works 7 days a week is complaining of generalized weakness progressing through the past several days. He states he works with [employer] and although he is indoors it is hot in that environment and he also works outside in the 90 deg plus heat.Marland Kitchen He is complaining of a runny nose, PND, scratchy throat and occasional cough. Denies fever. 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 Blood sugar reading this morning was 120.   Past Medical History  Diagnosis Date  . Diabetes mellitus   . Back pain    Past Surgical History  Procedure Laterality Date  . Appendectomy     Family History  Problem Relation Age of Onset  . Diabetes Other   . Cancer Other   . Hypertension Other    History  Substance Use Topics  . Smoking status: Never Smoker   . Smokeless tobacco: Not on file  . Alcohol Use: No    Review of Systems  Constitutional: Positive for activity change and fatigue. Negative for fever and chills.  HENT: Positive for congestion, postnasal drip and rhinorrhea. Negative for sore throat.   Eyes: Negative.   Respiratory: Positive for cough. Negative for shortness of breath and wheezing.   Cardiovascular: Negative.   Gastrointestinal: Positive for nausea.  Genitourinary: Negative.   Skin: Negative.   Neurological: Negative.     Allergies  Review of patient's allergies indicates no known allergies.  Home Medications   Prior to Admission medications   Medication Sig Start Date End Date Taking? Authorizing Provider  metFORMIN (GLUCOPHAGE) 500 MG tablet Take 500 mg by mouth daily with breakfast.   Yes Historical Provider, MD  cyclobenzaprine (FLEXERIL) 10 MG  tablet Take 1 tablet (10 mg total) by mouth 3 (three) times daily as needed for muscle spasms. Patient not taking: Reported on 07/07/2014 10/02/12   Joni Reining Pisciotta, PA-C  cyclobenzaprine (FLEXERIL) 10 MG tablet Take 1 tablet (10 mg total) by mouth 2 (two) times daily as needed for muscle spasms. Patient not taking: Reported on 07/07/2014 07/30/13   Emilia Beck, PA-C  naproxen (NAPROSYN) 500 MG tablet Take 1 tablet (500 mg total) by mouth 2 (two) times daily with a meal. Patient not taking: Reported on 07/07/2014 07/30/13   Emilia Beck, PA-C  naproxen sodium (ANAPROX) 220 MG tablet Take 220 mg by mouth daily.    Historical Provider, MD  oxyCODONE-acetaminophen (PERCOCET) 10-325 MG per tablet Take 1 tablet by mouth every 8 (eight) hours as needed. For back pain    Historical Provider, MD   BP 120/82 mmHg  Pulse 70  Temp(Src) 98.3 F (36.8 C) (Oral)  Resp 16  SpO2 97% Physical Exam  Constitutional: He is oriented to person, place, and time. He appears well-developed and well-nourished. No distress.  HENT:  Right Ear: External ear normal.  Left Ear: External ear normal.  Mouth/Throat: Oropharynx is clear and moist. No oropharyngeal exudate.  Eyes: Conjunctivae and EOM are normal. Pupils are equal, round, and reactive to light. Left eye exhibits no discharge.  Neck: Normal range of motion. Neck supple.  Cardiovascular: Normal rate, regular rhythm and normal heart sounds.   Pulmonary/Chest: Effort normal and breath sounds normal. No respiratory distress. He has  no wheezes. He has no rales.  Musculoskeletal: He exhibits no edema or tenderness.  Lymphadenopathy:    He has no cervical adenopathy.  Neurological: He is alert and oriented to person, place, and time.  Skin: Skin is warm and dry.  Nursing note and vitals reviewed.   ED Course  Procedures (including critical care time) Labs Review Labs Reviewed - No data to display  Imaging Review No results found.   MDM   1.  Other fatigue   2. Heat effect, initial encounter   3. 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 I suspect this is accommodation of either allergies or summer viral illness associated with heat-related illness. Drink plenty of fluids stay well-hydrated. Recommend rehydrating with Pedialyte or Gatorade augmented with water. Allegra or Claritin for drainage and nasal stuffiness. Off work rest of the day, resting: Varma and drink plenty of fluids.     Hayden Rasmussenavid Joby Hershkowitz, NP 10/01/14 1513

## 2014-10-01 NOTE — Discharge Instructions (Signed)
Fatigue Drink plenty of fluids stay well-hydrated. Recommend rehydrating with Pedia light or Gatorade augmented with water. Allegra or Claritin for drainage and nasal stuffiness. Off work rest of the day, resting: Varma and drink plenty of fluids. Fatigue is a feeling of tiredness, lack of energy, lack of motivation, or feeling tired all the time. Having enough rest, good nutrition, and reducing stress will normally reduce fatigue. Consult your caregiver if it persists. The nature of your fatigue will help your caregiver to find out its cause. The treatment is based on the cause.  CAUSES  There are many causes for fatigue. Most of the time, fatigue can be traced to one or more of your habits or routines. Most causes fit into one or more of three general areas. They are: Lifestyle problems  Sleep disturbances.  Overwork.  Physical exertion.  Unhealthy habits.  Poor eating habits or eating disorders.  Alcohol and/or drug use .  Lack of proper nutrition (malnutrition). Psychological problems  Stress and/or anxiety problems.  Depression.  Grief.  Boredom. Medical Problems or Conditions  Anemia.  Pregnancy.  Thyroid gland problems.  Recovery from major surgery.  Continuous pain.  Emphysema or asthma that is not well controlled  Allergic conditions.  Diabetes.  Infections (such as mononucleosis).  Obesity.  Sleep disorders, such as sleep apnea.  Heart failure or other heart-related problems.  Cancer.  Kidney disease.  Liver disease.  Effects of certain medicines such as antihistamines, cough and cold remedies, prescription pain medicines, heart and blood pressure medicines, drugs used for treatment of cancer, and some antidepressants. SYMPTOMS  The symptoms of fatigue include:   Lack of energy.  Lack of drive (motivation).  Drowsiness.  Feeling of indifference to the surroundings. DIAGNOSIS  The details of how you feel help guide your caregiver in  finding out what is causing the fatigue. You will be asked about your present and past health condition. It is important to review all medicines that you take, including prescription and non-prescription items. A thorough exam will be done. You will be questioned about your feelings, habits, and normal lifestyle. Your caregiver may suggest blood tests, urine tests, or other tests to look for common medical causes of fatigue.  TREATMENT  Fatigue is treated by correcting the underlying cause. For example, if you have continuous pain or depression, treating these causes will improve how you feel. Similarly, adjusting the dose of certain medicines will help in reducing fatigue.  HOME CARE INSTRUCTIONS   Try to get the required amount of good sleep every night.  Eat a healthy and nutritious diet, and drink enough water throughout the day.  Practice ways of relaxing (including yoga or meditation).  Exercise regularly.  Make plans to change situations that cause stress. Act on those plans so that stresses decrease over time. Keep your work and personal routine reasonable.  Avoid street drugs and minimize use of alcohol.  Start taking a daily multivitamin after consulting your caregiver. SEEK MEDICAL CARE IF:   You have persistent tiredness, which cannot be accounted for.  You have fever.  You have unintentional weight loss.  You have headaches.  You have disturbed sleep throughout the night.  You are feeling sad.  You have constipation.  You have dry skin.  You have gained weight.  You are taking any new or different medicines that you suspect are causing fatigue.  You are unable to sleep at night.  You develop any unusual swelling of your legs or other parts of  your body. SEEK IMMEDIATE MEDICAL CARE IF:   You are feeling confused.  Your vision is blurred.  You feel faint or pass out.  You develop severe headache.  You develop severe abdominal, pelvic, or back  pain.  You develop chest pain, shortness of breath, or an irregular or fast heartbeat.  You are unable to pass a normal amount of urine.  You develop abnormal bleeding such as bleeding from the rectum or you vomit blood.  You have thoughts about harming yourself or committing suicide.  You are worried that you might harm someone else. MAKE SURE YOU:   Understand these instructions.  Will watch your condition.  Will get help right away if you are not doing well or get worse. Document Released: 12/27/2006 Document Revised: 05/24/2011 Document Reviewed: 07/03/2013 [facility] Patient Information 2015 [place], [place]. This information is not intended to replace advice given to you by your health care provider. Make sure you discuss any questions you have with your health care provider.  Heat-Related Illness Heat-related illnesses occur when the body is unable to properly cool itself. The body normally cools itself by sweating. However, under some conditions sweating is not enough. In these cases, a person's body temperature rises rapidly. Very high body temperatures may damage the brain or other vital organs. Some examples of heat-related illnesses include:  Heat stroke. This occurs when the body is unable to regulate its temperature. The body's temperature rises rapidly, the sweating mechanism fails, and the body is unable to cool down. Body temperature may rise to 106 F (41 C) or higher within 10 to 15 minutes. Heat stroke can cause death or permanent disability if emergency treatment is not provided.  Heat exhaustion. This is a milder form of heat-related illness that can develop after several days of exposure to high temperatures and not enough fluids. It is the body's response to an excessive loss of the water and salt contained in sweat.  Heat cramps. These usually affect people who sweat a lot during heavy activity. This sweating drains the body's salt and moisture. The low salt level  in the muscles causes painful cramps. Heat cramps may also be a symptom of heat exhaustion. Heat cramps usually occur in the abdomen, arms, or legs. Get medical attention for cramps if you have heart problems or are on a low-sodium diet. Those that are at greatest risk for heat-related illnesses include:   The elderly.  Infant and the very young.  People with mental illness and chronic diseases.  People who are overweight (obese).  Young and healthy people can even succumb to heat if they participate in strenuous physical activities during hot weather. CAUSES  Several factors affect the body's ability to cool itself during extremely hot weather. When the humidity is high, sweat will not evaporate as quickly. This prevents the body from releasing heat quickly. Other factors that can affect the body's ability to cool down include:   Age.  Obesity.  Fever.  Dehydration.  Heart disease.  Mental illness.  Poor circulation.  Sunburn.  Prescription drug use.  Alcohol use. SYMPTOMS  Heat stroke: Warning signs of heat stroke vary, but may include:  An extremely high body temperature (above 103F orally).  A fast, strong pulse.  Dizziness.  Confusion.  Red, hot, and dry skin.  No sweating.  Throbbing headache.  Feeling sick to your stomach (nauseous).  Unconsciousness. Heat exhaustion: Warning signs of heat exhaustion include:  Heavy sweating.  Tiredness.  Headache.  Paleness.  Weakness.  Feeling sick to your stomach (nauseous) or vomiting.  Muscle cramps. Heat cramps  Muscle pains or spasms. TREATMENT  Heat stroke  Get into a cool environment. An indoor place that is air-conditioned may be best.  Take a cool shower or bath. Have someone around to make sure you are okay.  Take your temperature. Make sure it is going down. Heat exhaustion  Drink plenty of fluids. Do not drink liquids that contain caffeine, alcohol, or large amounts of sugar.  These cause you to lose more body fluid. Also, avoid very cold drinks. They can cause stomach cramps.  Get into a cool environment. An indoor place that is air-conditioned may be best.  Take a cool shower or bath. Have someone around to make sure you are okay.  Put on lightweight clothing. Heat cramps  Stop whatever activity you were doing. Do not attempt to do that activity for at least 3 hours after the cramps have gone away.  Get into a cool environment. An indoor place that is air-conditioned may be best. HOME CARE INSTRUCTIONS  To protect your health when temperatures are extremely high, follow these tips:  During heavy exercise in a hot environment, drink two to four glasses (16-32 ounces) of cool fluids each hour. Do not wait until you are thirsty to drink. Warning: If your caregiver limits the amount of fluid you drink or has you on water pills, ask how much you should drink while the weather is hot.  Do not drink liquids that contain caffeine, alcohol, or large amounts of sugar. These cause you to lose more body fluid.  Avoid very cold drinks. They can cause stomach cramps.  Wear appropriate clothing. Choose lightweight, light-colored, loose-fitting clothing.  If you must be outdoors, try to limit your outdoor activity to morning and evening hours. Try to rest often in shady areas.  If you are not used to working or exercising in a hot environment, start slowly and pick up the pace gradually.  Stay cool in an air-conditioned place if possible. If your home does not have air conditioning, go to the shopping mall or public library.  Taking a cool shToll Brothersh may help you cool off. SEEK MEDICAL CARE IF:   You see any of the symptoms listed above. You may be dealing with a life-threatening emergency.  Symptoms worsen or last longer than 1 hour.  Heat cramps do not get better in 1 hour. MAKE SURE YOU:   Understand these instructions.  Will watch your condition.  Will  get help right away if you are not doing well or get worse. Document Released: 12/09/2007 Document Revised: 05/24/2011 Document Reviewed: 12/09/2007 [facility] Patient Information 2015 [place], [place]. This information is not intended to replace advice given to you by your health care provider. Make sure you discuss any questions you have with your health care provider.  Viral Infections vs Allergies A virus is a type of germ. Viruses can cause:  Minor sore throats.  Aches and pains.  Headaches.  Runny nose.  Rashes.  Watery eyes.  Tiredness.  Coughs.  Loss of appetite.  Feeling sick to your stomach (nausea).  Throwing up (vomiting).  Watery poop (diarrhea). HOME CARE   Only take medicines as told by your doctor.  Drink enough water and fluids to keep your pee (urine) clear or pale yellow. Sports drinks are a good choice.  Get plenty of rest and eat healthy. Soups and broths with crackers or rice are fine. GET HELP RIGHT  AWAY IF:   You have a very bad headache.  You have shortness of breath.  You have chest pain or neck pain.  You have an unusual rash.  You cannot stop throwing up.  You have watery poop that does not stop.  You cannot keep fluids down.  You or your child has a temperature by mouth above 102 F (38.9 C), not controlled by medicine.  Your baby is older than 3 months with a rectal temperature of 102 F (38.9 C) or higher.  Your baby is 26 months old or younger with a rectal temperature of 100.4 F (38 C) or higher. MAKE SURE YOU:   Understand these instructions.  Will watch this condition.  Will get help right away if you are not doing well or get worse. Document Released: 02/12/2008 Document Revised: 05/24/2011 Document Reviewed: 07/07/2010 [facility] Patient Information 2015 [place], [place]. This information is not intended to replace advice given to you by your health care provider. Make sure you discuss any questions you have with your  health care provider.

## 2015-05-29 ENCOUNTER — Emergency Department (HOSPITAL_BASED_OUTPATIENT_CLINIC_OR_DEPARTMENT_OTHER): Payer: Self-pay

## 2015-05-29 ENCOUNTER — Emergency Department (HOSPITAL_BASED_OUTPATIENT_CLINIC_OR_DEPARTMENT_OTHER)
Admission: EM | Admit: 2015-05-29 | Discharge: 2015-05-29 | Disposition: A | Discharge status: Home / Self Care | Payer: Self-pay | Attending: Emergency Medicine | Admitting: Emergency Medicine

## 2015-05-29 ENCOUNTER — Encounter (HOSPITAL_BASED_OUTPATIENT_CLINIC_OR_DEPARTMENT_OTHER): Payer: Self-pay | Admitting: Emergency Medicine

## 2015-05-29 DIAGNOSIS — M25511 Pain in right shoulder: Secondary | ICD-10-CM | POA: Insufficient documentation

## 2015-05-29 DIAGNOSIS — M791 Myalgia, unspecified site: Secondary | ICD-10-CM

## 2015-05-29 DIAGNOSIS — R05 Cough: Secondary | ICD-10-CM | POA: Insufficient documentation

## 2015-05-29 DIAGNOSIS — Z7984 Long term (current) use of oral hypoglycemic drugs: Secondary | ICD-10-CM | POA: Insufficient documentation

## 2015-05-29 DIAGNOSIS — Z791 Long term (current) use of non-steroidal anti-inflammatories (NSAID): Secondary | ICD-10-CM | POA: Insufficient documentation

## 2015-05-29 DIAGNOSIS — B349 Viral infection, unspecified: Secondary | ICD-10-CM | POA: Insufficient documentation

## 2015-05-29 DIAGNOSIS — R079 Chest pain, unspecified: Secondary | ICD-10-CM | POA: Insufficient documentation

## 2015-05-29 DIAGNOSIS — E119 Type 2 diabetes mellitus without complications: Secondary | ICD-10-CM | POA: Insufficient documentation

## 2015-05-29 LAB — BASIC METABOLIC PANEL
Anion gap: 8 (ref 5–15)
BUN: 13 mg/dL (ref 6–20)
CO2: 26 mmol/L (ref 22–32)
CREATININE: 0.98 mg/dL (ref 0.61–1.24)
Calcium: 8.8 mg/dL — ABNORMAL LOW (ref 8.9–10.3)
Chloride: 101 mmol/L (ref 101–111)
GFR calc Af Amer: >60 mL/min (ref >60)
GFR calc non Af Amer: >60 mL/min (ref >60)
GLUCOSE: 222 mg/dL — AB (ref 65–99)
Potassium: 4.4 mmol/L (ref 3.5–5.1)
Sodium: 135 mmol/L (ref 135–145)

## 2015-05-29 LAB — CBC WITH DIFFERENTIAL/PLATELET
BASOS ABS: 0.1 10*3/uL (ref 0.0–0.1)
Basophils Relative: 1 %
Eosinophils Absolute: 0.2 10*3/uL (ref 0.0–0.7)
Eosinophils Relative: 3 %
HEMATOCRIT: 47 % (ref 39.0–52.0)
Hemoglobin: 17.2 g/dL — ABNORMAL HIGH (ref 13.0–17.0)
Lymphocytes Relative: 32 %
Lymphs Abs: 2.4 10*3/uL (ref 0.7–4.0)
MCH: 29.9 pg (ref 26.0–34.0)
MCHC: 36.6 g/dL — AB (ref 30.0–36.0)
MCV: 81.7 fL (ref 78.0–100.0)
MONOS PCT: 7 %
Monocytes Absolute: 0.5 10*3/uL (ref 0.1–1.0)
NEUTROS PCT: 58 %
Neutro Abs: 4.5 10*3/uL (ref 1.7–7.7)
Platelets: 294 10*3/uL (ref 150–400)
RBC: 5.75 MIL/uL (ref 4.22–5.81)
RDW: 13 % (ref 11.5–15.5)
WBC: 7.7 10*3/uL (ref 4.0–10.5)

## 2015-05-29 LAB — TROPONIN I

## 2015-05-29 MED ORDER — NAPROXEN 500 MG PO TABS: 500.0000 mg | ORAL_TABLET | Freq: Two times a day (BID) | ORAL | Status: DC

## 2015-05-29 MED ORDER — CETIRIZINE HCL 10 MG PO TBDP: 1.0000 | ORAL_TABLET | Freq: Every day | ORAL | Status: DC

## 2015-05-29 MED ORDER — METHOCARBAMOL 500 MG PO TABS: 500.0000 mg | ORAL_TABLET | Freq: Two times a day (BID) | ORAL | Status: DC

## 2015-05-29 MED ORDER — KETOROLAC TROMETHAMINE 30 MG/ML IJ SOLN
30.0000 mg | Freq: Once | INTRAMUSCULAR | Status: AC
  Administered 2015-05-29: 30 mg via INTRAVENOUS
  Filled 2015-05-29: qty 1

## 2015-05-29 NOTE — ED Notes (Signed)
MD at bedside. 

## 2015-05-29 NOTE — ED Notes (Signed)
Patient states that he has had generalized aches x 3 -4 days. Reports that he has a cough and sneezing

## 2015-05-29 NOTE — ED Provider Notes (Signed)
CSN: [id number]     Arrival date & time 05/29/15  0004 History   First MD Initiated Contact with Patient 05/29/15 0019     Chief Complaint  Patient presents with  . Generalized Body Aches     (Consider location/radiation/quality/duration/timing/severity/associated sxs/prior Treatment) Patient is a 40 y.o. male presenting with musculoskeletal pain. The history is provided by the patient.  Muscle Pain This is a new problem. The current episode started 2 days ago. The problem occurs constantly. The problem has not changed since onset.Pertinent negatives include no abdominal pain, no headaches and no shortness of breath. Nothing aggravates the symptoms. Nothing relieves the symptoms. He has tried nothing for the symptoms. The treatment provided no relief.  Chest and R shoulder pain all day it is with movement but has been constant for 20 hours.  No swelling in the legs no long car trips or plane trips.  Has had URI symptoms x 48 hours.    Past Medical History  Diagnosis Date  . Diabetes mellitus   . Back pain    Past Surgical History  Procedure Laterality Date  . Appendectomy     Family History  Problem Relation Age of Onset  . Diabetes Other   . Cancer Other   . Hypertension Other    Social History  Substance Use Topics  . Smoking status: Never Smoker   . Smokeless tobacco: None  . Alcohol Use: No    Review of Systems  Constitutional: Negative for fever.  Respiratory: Positive for cough. Negative for shortness of breath and wheezing.   Cardiovascular: Negative for palpitations and leg swelling.  Gastrointestinal: Negative for abdominal pain.  Neurological: Negative for headaches.  All other systems reviewed and are negative.     Allergies  Review of patient's allergies indicates no known allergies.  Home Medications   Prior to Admission medications   Medication Sig Start Date End Date Taking? Authorizing Provider  glimepiride (AMARYL) 2 MG tablet Take 2 mg by mouth  daily with breakfast.   Yes Historical Provider, MD  cyclobenzaprine (FLEXERIL) 10 MG tablet Take 1 tablet (10 mg total) by mouth 3 (three) times daily as needed for muscle spasms. Patient not taking: Reported on 07/07/2014 10/02/12   Joni ReiningNicole Pisciotta, PA-C  cyclobenzaprine (FLEXERIL) 10 MG tablet Take 1 tablet (10 mg total) by mouth 2 (two) times daily as needed for muscle spasms. Patient not taking: Reported on 07/07/2014 07/30/13   Emilia BeckKaitlyn Szekalski, PA-C  metFORMIN (GLUCOPHAGE) 500 MG tablet Take 500 mg by mouth daily with breakfast.    Historical Provider, MD  naproxen (NAPROSYN) 500 MG tablet Take 1 tablet (500 mg total) by mouth 2 (two) times daily with a meal. Patient not taking: Reported on 07/07/2014 07/30/13   Emilia BeckKaitlyn Szekalski, PA-C  naproxen sodium (ANAPROX) 220 MG tablet Take 220 mg by mouth daily.    Historical Provider, MD  oxyCODONE-acetaminophen (PERCOCET) 10-325 MG per tablet Take 1 tablet by mouth every 8 (eight) hours as needed. For back pain    Historical Provider, MD   BP 134/89 mmHg  Pulse 105  Temp(Src) 98.6 F (37 C) (Oral)  Resp 18  Ht 5\' 9"  (1.753 m)  Wt 252 lb (114.306 kg)  BMI 37.20 kg/m2  SpO2 99% Physical Exam  Constitutional: He is oriented to person, place, and time. He appears well-developed and well-nourished. No distress.  HENT:  Head: Normocephalic and atraumatic.  Mouth/Throat: Oropharynx is clear and moist.  Eyes: Conjunctivae are normal. Pupils are equal, round, and  reactive to light.  Neck: Normal range of motion. Neck supple.  Cardiovascular: Normal rate, regular rhythm and intact distal pulses.   Pulmonary/Chest: Effort normal and breath sounds normal. No respiratory distress. He has no wheezes. He has no rales.  Abdominal: Soft. Bowel sounds are normal. There is no tenderness. There is no rebound and no guarding.  Musculoskeletal: Normal range of motion. He exhibits no edema or tenderness.  Neurological: He is alert and oriented to person, place,  and time. He has normal reflexes.  Skin: Skin is warm and dry.  Psychiatric: He has a normal mood and affect.    ED Course  Procedures (including critical care time) Labs Review Labs Reviewed  CBC WITH DIFFERENTIAL/PLATELET  BASIC METABOLIC PANEL  TROPONIN I    Imaging Review No results found. I have personally reviewed and evaluated these images and lab results as part of my medical decision-making.   EKG Interpretation None      MDM   Final diagnoses:  None    EKG Interpretation  Date/Time:  Thursday May 29 2015 00:34:39 EDT Ventricular Rate:  98 PR Interval:  145 QRS Duration: 107 QT Interval:  346 QTC Calculation: 442 R Axis:   -72 Text Interpretation:  Sinus rhythm Confirmed by [facility]  MD, Deloris Mittag ([id number]) on 05/29/2015 12:41:09 AM       PERC negative wells 0 highly doubt PE, symptoms are not consistent with same and patient is extremely low risk for PE.  Patient's symptoms are reproducible with movement and palpation and are highly atypical for cardiac chest pain.  In the setting of pain of > 8 hours duration with normal EKG and troponin MI is ruled out HEART score is 1.  Patient is safe for discharge at this time with close follow up.  Strict chest pain return precautions given.  Patient verbalizes understanding and agrees to follow up    Everrett Lacasse, MD 05/29/15 [id number]

## 2015-05-29 NOTE — ED Notes (Signed)
MD at bedside discussing test results and dispo plan of care. 

## 2015-06-01 ENCOUNTER — Emergency Department (HOSPITAL_BASED_OUTPATIENT_CLINIC_OR_DEPARTMENT_OTHER)
Admission: EM | Admit: 2015-06-01 | Discharge: 2015-06-01 | Disposition: A | Discharge status: Home / Self Care | Payer: Self-pay | Attending: Emergency Medicine | Admitting: Emergency Medicine

## 2015-06-01 ENCOUNTER — Encounter (HOSPITAL_BASED_OUTPATIENT_CLINIC_OR_DEPARTMENT_OTHER): Payer: Self-pay

## 2015-06-01 ENCOUNTER — Emergency Department (HOSPITAL_BASED_OUTPATIENT_CLINIC_OR_DEPARTMENT_OTHER): Payer: Self-pay

## 2015-06-01 DIAGNOSIS — E119 Type 2 diabetes mellitus without complications: Secondary | ICD-10-CM | POA: Insufficient documentation

## 2015-06-01 DIAGNOSIS — Z7984 Long term (current) use of oral hypoglycemic drugs: Secondary | ICD-10-CM | POA: Insufficient documentation

## 2015-06-01 DIAGNOSIS — Z79899 Other long term (current) drug therapy: Secondary | ICD-10-CM | POA: Insufficient documentation

## 2015-06-01 DIAGNOSIS — R1010 Upper abdominal pain, unspecified: Secondary | ICD-10-CM | POA: Insufficient documentation

## 2015-06-01 DIAGNOSIS — Z791 Long term (current) use of non-steroidal anti-inflammatories (NSAID): Secondary | ICD-10-CM | POA: Insufficient documentation

## 2015-06-01 DIAGNOSIS — M25511 Pain in right shoulder: Secondary | ICD-10-CM | POA: Insufficient documentation

## 2015-06-01 DIAGNOSIS — R112 Nausea with vomiting, unspecified: Secondary | ICD-10-CM | POA: Insufficient documentation

## 2015-06-01 LAB — CBC WITH DIFFERENTIAL/PLATELET
BASOS ABS: 0 10*3/uL (ref 0.0–0.1)
BASOS PCT: 0 %
EOS ABS: 0 10*3/uL (ref 0.0–0.7)
Eosinophils Relative: 0 %
HCT: 46.3 % (ref 39.0–52.0)
Hemoglobin: 17.1 g/dL — ABNORMAL HIGH (ref 13.0–17.0)
Lymphocytes Relative: 4 %
Lymphs Abs: 0.4 10*3/uL — ABNORMAL LOW (ref 0.7–4.0)
MCH: 30.2 pg (ref 26.0–34.0)
MCHC: 36.9 g/dL — ABNORMAL HIGH (ref 30.0–36.0)
MCV: 81.7 fL (ref 78.0–100.0)
Monocytes Absolute: 0.7 10*3/uL (ref 0.1–1.0)
Monocytes Relative: 8 %
NEUTROS PCT: 88 %
Neutro Abs: 8.2 10*3/uL — ABNORMAL HIGH (ref 1.7–7.7)
PLATELETS: 237 10*3/uL (ref 150–400)
RBC: 5.67 MIL/uL (ref 4.22–5.81)
RDW: 13.2 % (ref 11.5–15.5)
WBC: 9.3 10*3/uL (ref 4.0–10.5)

## 2015-06-01 LAB — BASIC METABOLIC PANEL
ANION GAP: 12 (ref 5–15)
BUN: 22 mg/dL — ABNORMAL HIGH (ref 6–20)
CALCIUM: 8.6 mg/dL — AB (ref 8.9–10.3)
CO2: 22 mmol/L (ref 22–32)
CREATININE: 0.72 mg/dL (ref 0.61–1.24)
Chloride: 104 mmol/L (ref 101–111)
GFR calc non Af Amer: >60 mL/min (ref >60)
Glucose, Bld: 219 mg/dL — ABNORMAL HIGH (ref 65–99)
Potassium: 3.8 mmol/L (ref 3.5–5.1)
SODIUM: 138 mmol/L (ref 135–145)

## 2015-06-01 LAB — CBG MONITORING, ED: Glucose-Capillary: 232 mg/dL — ABNORMAL HIGH (ref 65–99)

## 2015-06-01 MED ORDER — SODIUM CHLORIDE 0.9 % IV BOLUS (SEPSIS)
1000.0000 mL | Freq: Once | INTRAVENOUS | Status: AC
  Administered 2015-06-01: 1000 mL via INTRAVENOUS

## 2015-06-01 MED ORDER — ONDANSETRON 4 MG PO TBDP: ORAL_TABLET | ORAL | Status: DC

## 2015-06-01 NOTE — ED Provider Notes (Signed)
CSN: [id number]     Arrival date & time 06/01/15  4098 History   First MD Initiated Contact with Patient 06/01/15 [phone]     Chief Complaint  Patient presents with  . Shoulder Pain  . Emesis     (Consider location/radiation/quality/duration/timing/severity/associated sxs/prior Treatment) HPI  40 year old male presents for evaluation of vomiting this started yesterday. Patient states his shoulder has been hurting for about 5 days. Was seen here a couple days ago for shoulder and chest pain. Chest pain is resolved but the shoulder is still hurting. Shoulder seems to be getting worse but no injury. He states he went to go carting yesterday in [place]. This is typical for him. Sometime in afternoon around 4 PM he started vomiting. Is not sure if it something he ate at a gas station or not. He vomited numerous times between 4 PM and 11 PM. Since then he has not been vomiting and has no nausea. He has some upper abdominal pain but he thinks this is because he is hungry. He was afraid to eat because he started driving back from [place]. Feels dehydrated and thinks he needs IV fluids.  Past Medical History  Diagnosis Date  . Diabetes mellitus   . Back pain    Past Surgical History  Procedure Laterality Date  . Appendectomy     Family History  Problem Relation Age of Onset  . Diabetes Other   . Cancer Other   . Hypertension Other    Social History  Substance Use Topics  . Smoking status: Never Smoker   . Smokeless tobacco: None  . Alcohol Use: No    Review of Systems  Constitutional: Negative for fever.  Respiratory: Negative for shortness of breath.   Cardiovascular: Negative for chest pain.  Gastrointestinal: Positive for vomiting and abdominal pain. Negative for nausea and blood in stool.  Musculoskeletal: Positive for arthralgias.  All other systems reviewed and are negative.     Allergies  Review of patient's allergies indicates no known allergies.  Home Medications    Prior to Admission medications   Medication Sig Start Date End Date Taking? Authorizing Provider  Cetirizine HCl (ZYRTEC ALLERGY) 10 MG TBDP Take 1 tablet by mouth daily. 05/29/15   April Palumbo, MD  cyclobenzaprine (FLEXERIL) 10 MG tablet Take 1 tablet (10 mg total) by mouth 3 (three) times daily as needed for muscle spasms. Patient not taking: Reported on 07/07/2014 10/02/12   Joni Reining Pisciotta, PA-C  cyclobenzaprine (FLEXERIL) 10 MG tablet Take 1 tablet (10 mg total) by mouth 2 (two) times daily as needed for muscle spasms. Patient not taking: Reported on 07/07/2014 07/30/13   Emilia Beck, PA-C  glimepiride (AMARYL) 2 MG tablet Take 2 mg by mouth daily with breakfast.    Historical Provider, MD  metFORMIN (GLUCOPHAGE) 500 MG tablet Take 500 mg by mouth daily with breakfast.    Historical Provider, MD  methocarbamol (ROBAXIN) 500 MG tablet Take 1 tablet (500 mg total) by mouth 2 (two) times daily. 05/29/15   April Palumbo, MD  naproxen (NAPROSYN) 500 MG tablet Take 1 tablet (500 mg total) by mouth 2 (two) times daily with a meal. Patient not taking: Reported on 07/07/2014 07/30/13   Emilia Beck, PA-C  naproxen (NAPROSYN) 500 MG tablet Take 1 tablet (500 mg total) by mouth 2 (two) times daily. 05/29/15   April Palumbo, MD  naproxen sodium (ANAPROX) 220 MG tablet Take 220 mg by mouth daily.    Historical Provider, MD  oxyCODONE-acetaminophen (PERCOCET) [phone]  MG per tablet Take 1 tablet by mouth every 8 (eight) hours as needed. For back pain    Historical Provider, MD   BP 122/85 mmHg  Pulse 96  Temp(Src) 98 F (36.7 C) (Oral)  Resp 22  Ht 5\' 9"  (1.753 m)  Wt 252 lb (114.306 kg)  BMI 37.20 kg/m2  SpO2 98% Physical Exam  Constitutional: He is oriented to person, place, and time. He appears well-developed and well-nourished.  Resting on bed/stretcher with both hands clasped behind head  HENT:  Head: Normocephalic and atraumatic.  Right Ear: External ear normal.  Left Ear: External  ear normal.  Nose: Nose normal.  Mouth/Throat: No oropharyngeal exudate.  Eyes: Right eye exhibits no discharge. Left eye exhibits no discharge.  Neck: Neck supple.  Cardiovascular: Normal rate, regular rhythm, normal heart sounds and intact distal pulses.   Pulses:      Radial pulses are 2+ on the right side, and 2+ on the left side.  Pulmonary/Chest: Effort normal and breath sounds normal.  Abdominal: Soft. He exhibits no distension. There is no tenderness.  Musculoskeletal: He exhibits no edema.       Right shoulder: He exhibits tenderness. He exhibits normal range of motion and no bony tenderness.  Neurological: He is alert and oriented to person, place, and time.  Skin: Skin is warm and dry.  Nursing note and vitals reviewed.   ED Course  Procedures (including critical care time) Labs Review Labs Reviewed  BASIC METABOLIC PANEL - Abnormal; Notable for the following:    Glucose, Bld 219 (*)    BUN 22 (*)    Calcium 8.6 (*)    All other components within normal limits  CBC WITH DIFFERENTIAL/PLATELET - Abnormal; Notable for the following:    Hemoglobin 17.1 (*)    MCHC 36.9 (*)    Neutro Abs 8.2 (*)    Lymphs Abs 0.4 (*)    All other components within normal limits  CBG MONITORING, ED - Abnormal; Notable for the following:    Glucose-Capillary 232 (*)    All other components within normal limits    Imaging Review Dg Shoulder Right  06/01/2015  CLINICAL DATA:  Initial encounter. 40 y/o male with worsening RIGHT shoulder pain x5 days with NKI. Pain at joint, runs down arm at times. States he may have slept on it wrong but doesn't explain the persistent pain. No prior injury or surgery to area. EXAM: RIGHT SHOULDER - 2+ VIEW COMPARISON:  Chest x-ray 05/29/2015 FINDINGS: There is no evidence of fracture or dislocation. There is no evidence of arthropathy or other focal bone abnormality. Soft tissues are unremarkable. IMPRESSION: Negative. Electronically Signed   By: Norva PavlovElizabeth   Larocca M.D.   On: 06/01/2015 10:33   I have personally reviewed and evaluated these images and lab results as part of my medical decision-making.   EKG Interpretation None      MDM   Final diagnoses:  Nausea and vomiting in adult  Right shoulder pain    Patient is no longer vomiting and feels well. Feels better after IV fluids. Likely related to food he ate yesterday. Abdominal exam is unremarkable. He is eating and drinking without difficulty. We'll give a small dose of Zofran in case nausea returns. Unclear why he is having shoulder pain but his shoulder exam shows minimal tenderness and full range of motion without pain. He is freely moving his shoulder. Plan to follow-up with PCP. Discussed return precautions.    Pricilla LovelessScott Eiko Mcgowen, MD 06/01/15  1613 

## 2015-06-01 NOTE — ED Notes (Signed)
Reports right shoulder pain x 2 days. Denies injury. Sts "I think I slept on it wrong, but it still hurts." Pt also reports vomiting since yesterday. Denies abdominal pain. Reports have TNTC episodes of emesis since.

## 2015-06-01 NOTE — ED Notes (Signed)
Pt. returned from XR. 

## 2015-06-02 ENCOUNTER — Emergency Department (HOSPITAL_COMMUNITY): Payer: Self-pay

## 2015-06-02 ENCOUNTER — Emergency Department (HOSPITAL_COMMUNITY)
Admission: EM | Admit: 2015-06-02 | Discharge: 2015-06-02 | Disposition: A | Discharge status: Home / Self Care | Payer: Self-pay | Attending: Emergency Medicine | Admitting: Emergency Medicine

## 2015-06-02 ENCOUNTER — Encounter (HOSPITAL_COMMUNITY): Payer: Self-pay | Admitting: Emergency Medicine

## 2015-06-02 DIAGNOSIS — Z7984 Long term (current) use of oral hypoglycemic drugs: Secondary | ICD-10-CM | POA: Insufficient documentation

## 2015-06-02 DIAGNOSIS — E119 Type 2 diabetes mellitus without complications: Secondary | ICD-10-CM | POA: Insufficient documentation

## 2015-06-02 DIAGNOSIS — Z79899 Other long term (current) drug therapy: Secondary | ICD-10-CM | POA: Insufficient documentation

## 2015-06-02 DIAGNOSIS — X58XXXD Exposure to other specified factors, subsequent encounter: Secondary | ICD-10-CM | POA: Insufficient documentation

## 2015-06-02 DIAGNOSIS — S46911D Strain of unspecified muscle, fascia and tendon at shoulder and upper arm level, right arm, subsequent encounter: Secondary | ICD-10-CM | POA: Insufficient documentation

## 2015-06-02 LAB — COMPREHENSIVE METABOLIC PANEL
ALK PHOS: 60 U/L (ref 38–126)
ALT: 33 U/L (ref 17–63)
ANION GAP: 10 (ref 5–15)
AST: 26 U/L (ref 15–41)
Albumin: 4 g/dL (ref 3.5–5.0)
BUN: 17 mg/dL (ref 6–20)
CALCIUM: 7.9 mg/dL — AB (ref 8.9–10.3)
CHLORIDE: 107 mmol/L (ref 101–111)
CO2: 20 mmol/L — AB (ref 22–32)
Creatinine, Ser: 0.61 mg/dL (ref 0.61–1.24)
GFR calc non Af Amer: >60 mL/min (ref >60)
Glucose, Bld: 232 mg/dL — ABNORMAL HIGH (ref 65–99)
Potassium: 4.2 mmol/L (ref 3.5–5.1)
SODIUM: 137 mmol/L (ref 135–145)
Total Bilirubin: 0.8 mg/dL (ref 0.3–1.2)
Total Protein: 7 g/dL (ref 6.5–8.1)

## 2015-06-02 LAB — CBC WITH DIFFERENTIAL/PLATELET
Basophils Absolute: 0 10*3/uL (ref 0.0–0.1)
Basophils Relative: 0 %
EOS ABS: 0.2 10*3/uL (ref 0.0–0.7)
EOS PCT: 3 %
HCT: 47.3 % (ref 39.0–52.0)
Hemoglobin: 17 g/dL (ref 13.0–17.0)
LYMPHS ABS: 1.1 10*3/uL (ref 0.7–4.0)
Lymphocytes Relative: 23 %
MCH: 30.4 pg (ref 26.0–34.0)
MCHC: 35.9 g/dL (ref 30.0–36.0)
MCV: 84.5 fL (ref 78.0–100.0)
MONO ABS: 0.6 10*3/uL (ref 0.1–1.0)
MONOS PCT: 13 %
NEUTROS ABS: 3 10*3/uL (ref 1.7–7.7)
NEUTROS PCT: 61 %
PLATELETS: 219 10*3/uL (ref 150–400)
RBC: 5.6 MIL/uL (ref 4.22–5.81)
RDW: 12.9 % (ref 11.5–15.5)
WBC: 4.9 10*3/uL (ref 4.0–10.5)

## 2015-06-02 LAB — I-STAT TROPONIN, ED: TROPONIN I, POC: 0 ng/mL (ref 0.00–0.08)

## 2015-06-02 LAB — LIPASE, BLOOD: Lipase: 28 U/L (ref 11–51)

## 2015-06-02 LAB — D-DIMER, QUANTITATIVE (NOT AT ARMC)

## 2015-06-02 MED ORDER — ONDANSETRON HCL 4 MG/2ML IJ SOLN
4.0000 mg | Freq: Once | INTRAMUSCULAR | Status: AC
  Administered 2015-06-02: 4 mg via INTRAVENOUS
  Filled 2015-06-02: qty 2

## 2015-06-02 MED ORDER — HYDROCODONE-ACETAMINOPHEN 5-325 MG PO TABS: ORAL_TABLET | ORAL | Status: DC

## 2015-06-02 MED ORDER — METHOCARBAMOL 500 MG PO TABS: 1000.0000 mg | ORAL_TABLET | Freq: Four times a day (QID) | ORAL | Status: DC | PRN

## 2015-06-02 MED ORDER — MORPHINE SULFATE (PF) 4 MG/ML IV SOLN
4.0000 mg | Freq: Once | INTRAVENOUS | Status: AC
  Administered 2015-06-02: 4 mg via INTRAVENOUS
  Filled 2015-06-02: qty 1

## 2015-06-02 NOTE — ED Notes (Signed)
Patient c/o right shoulder pain x6 days. [facility] for same. Patient received XR at that time. Patient unable to recall any type of injury. Patient has attempted tylenol at home without relief.

## 2015-06-02 NOTE — ED Notes (Signed)
ED PA at bedside

## 2015-06-02 NOTE — ED Provider Notes (Signed)
CSN: [id number]     Arrival date & time 06/02/15  0534 History   First MD Initiated Contact with Patient 06/02/15 [phone]     Chief Complaint  Patient presents with  . Shoulder Pain    right     (Consider location/radiation/quality/duration/timing/severity/associated sxs/prior Treatment) HPI   Blood pressure 140/94, pulse 104, temperature 98.4 F (36.9 C), temperature source Oral, resp. rate 18, height  (1.753 m), weight 115.667 kg, SpO2 98 %.  Dale Reed is a 40 y.o. male complaining of severe, 10 out of 10 right-sided shoulder pain onset 3 days ago, initially shows a with chest pain, we had a second visit associated with abdominal pain and emesis yesterday. He's been taking Tylenol at home with little relief. Associated symptoms of shortness of breath. 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 He denies numbness, weakness, chest pain, syncope, history of DVT, PE, recent immobilizations, calf pain, leg swelling. Cannot identify any exacerbating or alleviating symptoms.  Past Medical History  Diagnosis Date  . Diabetes mellitus   . Back pain    Past Surgical History  Procedure Laterality Date  . Appendectomy     Family History  Problem Relation Age of Onset  . Diabetes Other   . Cancer Other   . Hypertension Other    Social History  Substance Use Topics  . Smoking status: Never Smoker   . Smokeless tobacco: None  . Alcohol Use: No    Review of Systems  10 systems reviewed and found to be negative, except as noted in the HPI.   Allergies  Review of patient's allergies indicates no known allergies.  Home Medications   Prior to Admission medications   Medication Sig Start Date End Date Taking? Authorizing Provider  Cetirizine HCl (ZYRTEC ALLERGY) 10 MG TBDP Take 1 tablet by mouth daily. 05/29/15  Yes April Palumbo, MD  glimepiride (AMARYL) 2 MG tablet Take 2 mg by mouth daily with  breakfast.   Yes Historical Provider, MD  metFORMIN (GLUCOPHAGE) 500 MG tablet Take 500 mg by mouth daily with breakfast.   Yes Historical Provider, MD  naproxen sodium (ANAPROX) 220 MG tablet Take 220 mg by mouth daily as needed (pain).    Yes Historical Provider, MD  HYDROcodone-acetaminophen (NORCO/VICODIN) 5-325 MG tablet Take 1-2 tablets by mouth every 6 hours as needed for pain and/or cough. 06/02/15   Leylany Nored, PA-C  methocarbamol (ROBAXIN) 500 MG tablet Take 2 tablets (1,000 mg total) by mouth 4 (four) times daily as needed (Pain). 06/02/15   Greogory Cornette, PA-C  naproxen (NAPROSYN) 500 MG tablet Take 1 tablet (500 mg total) by mouth 2 (two) times daily. Patient not taking: Reported on 06/02/2015 05/29/15   April Palumbo, MD  ondansetron (ZOFRAN ODT) 4 MG disintegrating tablet  ODT q6 hours prn nausea/vomiting Patient not taking: Reported on 06/02/2015 06/01/15   Pricilla Loveless, MD   BP 120/79 mmHg  Pulse 69  Temp(Src) 97.8 F (36.6 C) (Oral)  Resp 18  Ht  (1.753 m)  Wt 115.667 kg  BMI 37.64 kg/m2  SpO2 98% Physical Exam  Constitutional: He is oriented to person, place, and time. He appears well-developed and well-nourished. No distress.  Tearful  HENT:  Head: Normocephalic and atraumatic.  Mouth/Throat: Oropharynx is clear and moist.  Eyes: Conjunctivae and EOM are normal. Pupils are equal, round, and reactive to light.  Neck: Normal range of motion.  Cardiovascular: Normal rate, regular  rhythm and intact distal pulses.   Pulmonary/Chest: Effort normal and breath sounds normal.  Abdominal: Soft. There is no tenderness.  Musculoskeletal: Normal range of motion. He exhibits tenderness.  Right shoulder with no deformity, full range of motion, no focal tenderness  Neurological: He is alert and oriented to person, place, and time.  Skin: He is not diaphoretic.  Psychiatric: He has a normal mood and affect.  Nursing note and vitals reviewed.   ED Course   Procedures (including critical care time) Labs Review Labs Reviewed  COMPREHENSIVE METABOLIC PANEL - Abnormal; Notable for the following:    CO2 20 (*)    Glucose, Bld 232 (*)    Calcium 7.9 (*)    All other components within normal limits  D-DIMER, QUANTITATIVE (NOT AT [facility])  CBC WITH DIFFERENTIAL/PLATELET  LIPASE, BLOOD  I-STAT TROPOININ, ED    Imaging Review Dg Chest 2 View  06/02/2015  CLINICAL DATA:  Ongoing right shoulder pain.  Diabetes. EXAM: CHEST  2 VIEW COMPARISON:  05/29/2015 FINDINGS: Mild thoracic spondylosis. The lungs appear clear. Cardiac and mediastinal contours normal. No pleural effusion identified. IMPRESSION: 1.  No active cardiopulmonary disease is radiographically apparent. 2. Mild thoracic spondylosis. Electronically Signed   By: Gaylyn RongWalter  Liebkemann M.D.   On: 06/02/2015 08:21   Dg Shoulder Right  06/01/2015  CLINICAL DATA:  Initial encounter. 40 y/o male with worsening RIGHT shoulder pain x5 days with NKI. Pain at joint, runs down arm at times. States he may have slept on it wrong but doesn't explain the persistent pain. No prior injury or surgery to area. EXAM: RIGHT SHOULDER - 2+ VIEW COMPARISON:  Chest x-ray 05/29/2015 FINDINGS: There is no evidence of fracture or dislocation. There is no evidence of arthropathy or other focal bone abnormality. Soft tissues are unremarkable. IMPRESSION: Negative. Electronically Signed   By: Norva PavlovElizabeth  Dantonio M.D.   On: 06/01/2015 10:33   I have personally reviewed and evaluated these images and lab results as part of my medical decision-making.   EKG Interpretation None      MDM   Final diagnoses:  Right shoulder strain, subsequent encounter    Filed Vitals:   06/02/15 0541 06/02/15 0900 06/02/15 0930 06/02/15 0956  BP: 140/94 121/77 120/79   Pulse: 104 78 69   Temp: 98.4 F (36.9 C)   97.8 F (36.6 C)  TempSrc: Oral   Oral  Resp: 18     Height: 5\' 9"  (1.753 m)     Weight: 115.667 kg     SpO2: 98% 98% 98%      Medications  morphine 4 MG/ML injection 4 mg (4 mg Intravenous Given 06/02/15 0834)  ondansetron (ZOFRAN) injection 4 mg (4 mg Intravenous Given 06/02/15 0830)    Rene PaciJessie Ask is 40 y.o. male presenting withSevere recurrent atraumatic right shoulder pain. Associated with shortness of breath. Patient seen for similar last week. EKG with no acute changes, troponin negative. Dimer negative, orthopedic referral given. Patient also advised to follow closely with primary care.  Evaluation does not show pathology that would require ongoing emergent intervention or inpatient treatment. Pt is hemodynamically stable and mentating appropriately. Discussed findings and plan with patient/guardian, who agrees with care plan. All questions answered. Return precautions discussed and outpatient follow up given.   New Prescriptions   HYDROCODONE-ACETAMINOPHEN (NORCO/VICODIN) 5-325 MG TABLET    Take 1-2 tablets by mouth every 6 hours as needed for pain and/or cough.   METHOCARBAMOL (ROBAXIN) 500 MG TABLET    Take 2  tablets (1,000 mg total) by mouth 4 (four) times daily as needed (Pain).         Wynetta Emery, PA-C 06/02/15 4098  Wynetta Emery, PA-C 06/02/15 1010  Benjiman Core, MD 06/02/15 1600

## 2015-06-02 NOTE — ED Notes (Signed)
Pt decline to apply in ED. Pt given instruction on application

## 2015-06-02 NOTE — Discharge Instructions (Signed)
Only use the arm sling for up to 2 days. Take the arm out and rotate the shoulder every 4 hours.   For pain control please take ibuprofen (also known as Motrin or Advil) 800mg  (this is normally 4 over the counter pills) 3 times a day  for 5 days. Take with food to minimize stomach irritation.  Take robaxin and/or Vicodin for breakthrough pain, do not drink alcohol, drive, care for children or perfom other critical tasks while taking robaxin and/or Vicodin .  Please follow with your primary care doctor in the next 2 days for a check-up. They must obtain records for further management.   Do not hesitate to return to the Emergency Department for any new, worsening or concerning symptoms.

## 2015-06-02 NOTE — ED Notes (Signed)
DELAY IN DISCHARGE 

## 2015-08-29 ENCOUNTER — Emergency Department (HOSPITAL_COMMUNITY)
Admission: EM | Admit: 2015-08-29 | Discharge: 2015-08-29 | Disposition: A | Discharge status: Home / Self Care | Payer: Self-pay | Attending: Emergency Medicine | Admitting: Emergency Medicine

## 2015-08-29 ENCOUNTER — Encounter (HOSPITAL_COMMUNITY): Payer: Self-pay | Admitting: *Deleted

## 2015-08-29 ENCOUNTER — Emergency Department (HOSPITAL_COMMUNITY): Payer: Self-pay

## 2015-08-29 DIAGNOSIS — Z79899 Other long term (current) drug therapy: Secondary | ICD-10-CM | POA: Insufficient documentation

## 2015-08-29 DIAGNOSIS — Z7984 Long term (current) use of oral hypoglycemic drugs: Secondary | ICD-10-CM | POA: Insufficient documentation

## 2015-08-29 DIAGNOSIS — K2970 Gastritis, unspecified, without bleeding: Secondary | ICD-10-CM

## 2015-08-29 DIAGNOSIS — Z791 Long term (current) use of non-steroidal anti-inflammatories (NSAID): Secondary | ICD-10-CM | POA: Insufficient documentation

## 2015-08-29 DIAGNOSIS — R1013 Epigastric pain: Secondary | ICD-10-CM | POA: Insufficient documentation

## 2015-08-29 DIAGNOSIS — Z79891 Long term (current) use of opiate analgesic: Secondary | ICD-10-CM | POA: Insufficient documentation

## 2015-08-29 DIAGNOSIS — E119 Type 2 diabetes mellitus without complications: Secondary | ICD-10-CM | POA: Insufficient documentation

## 2015-08-29 LAB — URINE MICROSCOPIC-ADD ON

## 2015-08-29 LAB — CBC
HCT: 44 % (ref 39.0–52.0)
Hemoglobin: 15.8 g/dL (ref 13.0–17.0)
MCH: 29.7 pg (ref 26.0–34.0)
MCHC: 35.9 g/dL (ref 30.0–36.0)
MCV: 82.7 fL (ref 78.0–100.0)
Platelets: 250 10*3/uL (ref 150–400)
RBC: 5.32 MIL/uL (ref 4.22–5.81)
RDW: 12.8 % (ref 11.5–15.5)
WBC: 7.1 10*3/uL (ref 4.0–10.5)

## 2015-08-29 LAB — URINALYSIS, ROUTINE W REFLEX MICROSCOPIC
BILIRUBIN URINE: NEGATIVE
Glucose, UA: >1000 mg/dL — AB
HGB URINE DIPSTICK: NEGATIVE
Ketones, ur: 15 mg/dL — AB
Leukocytes, UA: NEGATIVE
Nitrite: NEGATIVE
PROTEIN: NEGATIVE mg/dL
Specific Gravity, Urine: 1.015 (ref 1.005–1.030)
pH: 7 (ref 5.0–8.0)

## 2015-08-29 LAB — COMPREHENSIVE METABOLIC PANEL
ALBUMIN: 3.9 g/dL (ref 3.5–5.0)
ALK PHOS: 81 U/L (ref 38–126)
ALT: 21 U/L (ref 17–63)
ANION GAP: 7 (ref 5–15)
AST: 19 U/L (ref 15–41)
BILIRUBIN TOTAL: 0.5 mg/dL (ref 0.3–1.2)
BUN: 16 mg/dL (ref 6–20)
CALCIUM: 9.2 mg/dL (ref 8.9–10.3)
CO2: 22 mmol/L (ref 22–32)
Chloride: 107 mmol/L (ref 101–111)
Creatinine, Ser: 0.78 mg/dL (ref 0.61–1.24)
GFR calc Af Amer: >60 mL/min (ref >60)
GLUCOSE: 252 mg/dL — AB (ref 65–99)
Potassium: 3.8 mmol/L (ref 3.5–5.1)
Sodium: 136 mmol/L (ref 135–145)
TOTAL PROTEIN: 6.8 g/dL (ref 6.5–8.1)

## 2015-08-29 LAB — CBG MONITORING, ED: GLUCOSE-CAPILLARY: 242 mg/dL — AB (ref 65–99)

## 2015-08-29 LAB — I-STAT TROPONIN, ED: Troponin i, poc: 0 ng/mL (ref 0.00–0.08)

## 2015-08-29 LAB — LIPASE, BLOOD: Lipase: 29 U/L (ref 11–51)

## 2015-08-29 MED ORDER — ONDANSETRON HCL 4 MG/2ML IJ SOLN
4.0000 mg | Freq: Once | INTRAMUSCULAR | Status: AC
  Administered 2015-08-29: 4 mg via INTRAVENOUS
  Filled 2015-08-29: qty 2

## 2015-08-29 MED ORDER — GI COCKTAIL ~~LOC~~
30.0000 mL | Freq: Once | ORAL | Status: AC
  Administered 2015-08-29: 30 mL via ORAL
  Filled 2015-08-29: qty 30

## 2015-08-29 MED ORDER — OMEPRAZOLE 20 MG PO CPDR: 20.0000 mg | DELAYED_RELEASE_CAPSULE | Freq: Every day | ORAL | Status: DC

## 2015-08-29 MED ORDER — MORPHINE SULFATE (PF) 4 MG/ML IV SOLN
4.0000 mg | Freq: Once | INTRAVENOUS | Status: AC
  Administered 2015-08-29: 4 mg via INTRAVENOUS
  Filled 2015-08-29: qty 1

## 2015-08-29 MED ORDER — IOPAMIDOL (ISOVUE-300) INJECTION 61%
INTRAVENOUS | Status: AC
  Administered 2015-08-29: 100 mL
  Filled 2015-08-29: qty 100

## 2015-08-29 NOTE — ED Notes (Addendum)
[facility] c/o abd pain onset at 2300 with nausea. Pt recently started on cholesterol medication. EMS VS 155/98, pulse 60, 98% RA cbg 236 takes metformin

## 2015-08-29 NOTE — ED Provider Notes (Signed)
.   Please see previous physicians note regarding patient's presenting history and physical, initial ED course, and associated medical decision making. 40 year old male who presents with 1 day of progressive epigastric abdominal pain. His overall unremarkable CBC, comp and metabolic panel, lipase and unremarkable ultrasound abdomen. Pending CT abdomen and pelvis at time of sign out. This shows some moderate amount of stool, but clinically does not have constipation he states. No other acute intra-abdominal process visualized on CT. On my reexam he has soft and benign abdomen. Does report taking daily NSAIDs, which may contribute to potential gastritis. Started on Prilosec here and he will follow-up with his primary care doctor. Strict return and follow-up instructions reviewed. He expressed understanding of all discharge instructions and felt comfortable with the plan of care.    Lavera Guiseana Duo Liu, MD 08/29/15 [phone]

## 2015-08-29 NOTE — ED Notes (Signed)
Pt given urinal and instructed to provide urine sample in a timely manner; pt verbalized understanding

## 2015-08-29 NOTE — ED Notes (Signed)
Patient transported to Ultrasound 

## 2015-08-29 NOTE — ED Notes (Signed)
Pt returned to room B19 from [place]

## 2015-08-29 NOTE — Discharge Instructions (Signed)
Your CT scan and ultrasound did not show any serious cause of your symptoms. You may have inflammation of the stomach. Avoid taking any anti-inflammatory (aleve, motrin, ibuprofen, naprosyn) medications for now as it can cause gastritis and worsen your pain. Follow up closely with her primary care doctor. Return for worsening symptoms, including escalating pain, vomiting unable to keep down food or fluids, black tarry stools, fever, or any other symptoms concerning to you. Gastritis, Adult Gastritis is soreness and puffiness (inflammation) of the lining of the stomach. If you do not get help, gastritis can cause bleeding and sores (ulcers) in the stomach. HOME CARE   Only take medicine as told by your doctor.  If you were given antibiotic medicines, take them as told. Finish the medicines even if you start to feel better.  Drink enough fluids to keep your pee (urine) clear or pale yellow.  Avoid foods and drinks that make your problems worse. Foods you may want to avoid include:  Caffeine or alcohol.  Chocolate.  Mint.  Garlic and onions.  Spicy foods.  Citrus fruits, including oranges, lemons, or limes.  Food containing tomatoes, including sauce, chili, salsa, and pizza.  Fried and fatty foods.  Eat small meals throughout the day instead of large meals. GET HELP RIGHT AWAY IF:   You have black or dark red poop (stools).  You throw up (vomit) blood. It may look like coffee grounds.  You cannot keep fluids down.  Your belly (abdominal) pain gets worse.  You have a fever.  You do not feel better after 1 week.  You have any other questions or concerns. MAKE SURE YOU:   Understand these instructions.  Will watch your condition.  Will get help right away if you are not doing well or get worse.   This information is not intended to replace advice given to you by your health care provider. Make sure you discuss any questions you have with your health care provider.     Document Released: 08/18/2007 Document Revised: 05/24/2011 Document Reviewed: 04/14/2011 Elsevier Interactive Patient Education 2016 Elsevier Inc.   Abdominal Pain, Adult Many things can cause belly (abdominal) pain. Most times, the belly pain is not dangerous. Many cases of belly pain can be watched and treated at home. HOME CARE   Do not take medicines that help you go poop (laxatives) unless told to by your doctor.  Only take medicine as told by your doctor.  Eat or drink as told by your doctor. Your doctor will tell you if you should be on a special diet. GET HELP IF:  You do not know what is causing your belly pain.  You have belly pain while you are sick to your stomach (nauseous) or have runny poop (diarrhea).  You have pain while you pee or poop.  Your belly pain wakes you up at night.  You have belly pain that gets worse or better when you eat.  You have belly pain that gets worse when you eat fatty foods.  You have a fever. GET HELP RIGHT AWAY IF:   The pain does not go away within 2 hours.  You keep throwing up (vomiting).  The pain changes and is only in the right or left part of the belly.  You have bloody or tarry looking poop. MAKE SURE YOU:   Understand these instructions.  Will watch your condition.  Will get help right away if you are not doing well or get worse.   This information  is not intended to replace advice given to you by your health care provider. Make sure you discuss any questions you have with your health care provider.   Document Released: 08/18/2007 Document Revised: 03/22/2014 Document Reviewed: 11/08/2012 Elsevier Interactive Patient Education [employer]

## 2015-08-29 NOTE — ED Provider Notes (Signed)
CSN: [id number]     Arrival date & time 08/29/15  0429 History   First MD Initiated Contact with Patient 08/29/15 0530     Chief Complaint  Patient presents with  . Abdominal Pain     (Consider location/radiation/quality/duration/timing/severity/associated sxs/prior Treatment) HPI Comments: Patient is a 40 year old male who presents with a 12 hour history of worsening epigastric pain. He feels nauseated but denies any fevers or chills. He denies any bowel or bladder complaints. He reports never having had symptoms like this before. He denies any chest pain or shortness of breath.  Patient is a 40 y.o. male presenting with abdominal pain. The history is provided by the patient.  Abdominal Pain Pain location:  Epigastric Pain quality: stabbing   Pain radiates to:  Does not radiate Pain severity:  Moderate Onset quality:  Gradual Duration:  12 hours Timing:  Constant Progression:  Worsening Chronicity:  New Context: not alcohol use   Relieved by:  Nothing Worsened by:  Nothing tried Associated symptoms: no diarrhea and no fever     Past Medical History  Diagnosis Date  . Diabetes mellitus   . Back pain    Past Surgical History  Procedure Laterality Date  . Appendectomy     Family History  Problem Relation Age of Onset  . Diabetes Other   . Cancer Other   . Hypertension Other    Social History  Substance Use Topics  . Smoking status: Never Smoker   . Smokeless tobacco: None  . Alcohol Use: No    Review of Systems  Constitutional: Negative for fever.  Gastrointestinal: Positive for abdominal pain. Negative for diarrhea.  All other systems reviewed and are negative.     Allergies  Review of patient's allergies indicates no known allergies.  Home Medications   Prior to Admission medications   Medication Sig Start Date End Date Taking? Authorizing Provider  glimepiride (AMARYL) 2 MG tablet Take 2 mg by mouth daily with breakfast.   Yes Historical Provider, MD   metFORMIN (GLUCOPHAGE) 500 MG tablet Take 500 mg by mouth daily with breakfast.   Yes Historical Provider, MD  PRESCRIPTION MEDICATION Take 1 tablet by mouth daily. Cholesterol   Yes Historical Provider, MD  Cetirizine HCl (ZYRTEC ALLERGY) 10 MG TBDP Take 1 tablet by mouth daily. Patient not taking: Reported on 08/29/2015 05/29/15   April Palumbo, MD  HYDROcodone-acetaminophen (NORCO/VICODIN) 5-325 MG tablet Take 1-2 tablets by mouth every 6 hours as needed for pain and/or cough. Patient not taking: Reported on 08/29/2015 06/02/15   Joni ReiningNicole Pisciotta, PA-C  methocarbamol (ROBAXIN) 500 MG tablet Take 2 tablets (1,000 mg total) by mouth 4 (four) times daily as needed (Pain). Patient not taking: Reported on 08/29/2015 06/02/15   Joni ReiningNicole Pisciotta, PA-C  naproxen (NAPROSYN) 500 MG tablet Take 1 tablet (500 mg total) by mouth 2 (two) times daily. Patient not taking: Reported on 06/02/2015 05/29/15   April Palumbo, MD  ondansetron (ZOFRAN ODT) 4 MG disintegrating tablet 4mg  ODT q6 hours prn nausea/vomiting Patient not taking: Reported on 06/02/2015 06/01/15   Pricilla LovelessScott Goldston, MD   BP 130/66 mmHg  Pulse 64  Temp(Src) 97.9 F (36.6 C) (Oral)  Resp 0  Ht 5\' 9"  (1.753 m)  Wt 248 lb (112.492 kg)  BMI 36.61 kg/m2  SpO2 98% Physical Exam  Constitutional: He is oriented to person, place, and time. He appears well-developed and well-nourished. No distress.  HENT:  Head: Normocephalic and atraumatic.  Mouth/Throat: Oropharynx is clear and moist.  Neck:  Normal range of motion. Neck supple.  Cardiovascular: Normal rate and regular rhythm.  Exam reveals no friction rub.   No murmur heard. Pulmonary/Chest: Effort normal and breath sounds normal. No respiratory distress. He has no wheezes. He has no rales.  Abdominal: Soft. Bowel sounds are normal. He exhibits no distension. There is tenderness. There is no rebound and no guarding.  There is tenderness to palpation in the epigastric region. Eulah Pont sign is absent.   Musculoskeletal: Normal range of motion. He exhibits no edema.  Neurological: He is alert and oriented to person, place, and time. Coordination normal.  Skin: Skin is warm and dry. He is not diaphoretic.  Nursing note and vitals reviewed.   ED Course  Procedures (including critical care time) Labs Review Labs Reviewed  COMPREHENSIVE METABOLIC PANEL - Abnormal; Notable for the following:    Glucose, Bld 252 (*)    All other components within normal limits  CBG MONITORING, ED - Abnormal; Notable for the following:    Glucose-Capillary 242 (*)    All other components within normal limits  LIPASE, BLOOD  CBC  URINALYSIS, ROUTINE W REFLEX MICROSCOPIC (NOT AT [facility])    Imaging Review US Abdomen Limited Ruq  08/29/2015  CLINICAL DATA:  Right upper quadrant pain for several hours EXAM: US ABDOMEN LIMITED - RIGHT UPPER QUADRANT COMPARISON:  None. FINDINGS: Gallbladder: No gallstones or wall thickening visualized. No sonographic Murphy sign noted by sonographer. Common bile duct: Diameter: 3.3 mm. Liver: Slightly increased in echogenicity likely related to fatty infiltration. No focal mass lesion is noted. IMPRESSION: Likely fatty liver. No acute abnormality is noted. Electronically Signed   By: Alcide Clever M.D.   On: 08/29/2015 06:44   I have personally reviewed and evaluated these images and lab results as part of my medical decision-making.  ED ECG REPORT   Date: 08/29/2015  Rate: 78  Rhythm: normal sinus rhythm  QRS Axis: normal  Intervals: normal  ST/T Wave abnormalities: normal  Conduction Disutrbances:none  Narrative Interpretation:   Old EKG Reviewed: none available  I have personally reviewed the EKG tracing and agree with the computerized printout as noted.   MDM   Final diagnoses:  Epigastric pain    Patient presents with worsening epigastric pain over the last 12 hours. His workup reveals no elevation of white count, normal lipase and LFTs, and ultrasound which  reveals no evidence for gallstones. He was given a GI cocktail without much relief. He continues to complain of significant discomfort. He will now undergo a CT scan of the abdomen and pelvis to rule out intra-abdominal pathology. Care will be signed out to Dr. Verdie Mosher at shift change.    Geoffery Lyons, MD 08/29/15 [phone]

## 2016-03-10 ENCOUNTER — Emergency Department (HOSPITAL_BASED_OUTPATIENT_CLINIC_OR_DEPARTMENT_OTHER)
Admission: EM | Admit: 2016-03-10 | Discharge: 2016-03-10 | Disposition: A | Discharge status: Home / Self Care | Payer: BLUE CROSS/BLUE SHIELD | Attending: Emergency Medicine | Admitting: Emergency Medicine

## 2016-03-10 ENCOUNTER — Encounter (HOSPITAL_BASED_OUTPATIENT_CLINIC_OR_DEPARTMENT_OTHER): Payer: Self-pay

## 2016-03-10 DIAGNOSIS — Z7984 Long term (current) use of oral hypoglycemic drugs: Secondary | ICD-10-CM | POA: Insufficient documentation

## 2016-03-10 DIAGNOSIS — J069 Acute upper respiratory infection, unspecified: Secondary | ICD-10-CM | POA: Diagnosis not present

## 2016-03-10 DIAGNOSIS — E119 Type 2 diabetes mellitus without complications: Secondary | ICD-10-CM | POA: Diagnosis not present

## 2016-03-10 DIAGNOSIS — B9789 Other viral agents as the cause of diseases classified elsewhere: Secondary | ICD-10-CM

## 2016-03-10 DIAGNOSIS — R05 Cough: Secondary | ICD-10-CM | POA: Diagnosis present

## 2016-03-10 LAB — RAPID STREP SCREEN (MED CTR MEBANE ONLY): STREPTOCOCCUS, GROUP A SCREEN (DIRECT): NEGATIVE

## 2016-03-10 MED ORDER — FLUTICASONE PROPIONATE 50 MCG/ACT NA SUSP: 2.0000 | Freq: Every day | NASAL | 0 refills | Status: DC

## 2016-03-10 MED ORDER — CETIRIZINE-PSEUDOEPHEDRINE ER 5-120 MG PO TB12: 1.0000 | ORAL_TABLET | Freq: Every day | ORAL | 0 refills | Status: DC

## 2016-03-10 NOTE — ED Triage Notes (Signed)
C/o cold s/s x 2-3 days-NAD-steady gait

## 2016-03-10 NOTE — Discharge Instructions (Signed)
Drink plenty of fluids

## 2016-03-10 NOTE — ED Provider Notes (Signed)
[facility]   CSN: [id number] Arrival date & time: 03/10/16  1106     History   Chief Complaint Chief Complaint  Patient presents with  . Cough    HPI Dale Reed is a 40 y.o. male who presents with URI symptoms. PMH significant for Type 2 DM. He states that over the past several days he has had nasal congestion, rhinorrhea, sore throat, and a headache. The symptoms have gradually worsened despite taking OTC therapy. He denies fever, chills, ear pain, chest pain, cough, SOB, wheezing, abdominal pain, N/V.  HPI  Past Medical History:  Diagnosis Date  . Back pain   . Diabetes mellitus     There are no active problems to display for this patient.   Past Surgical History:  Procedure Laterality Date  . APPENDECTOMY         Home Medications    Prior to Admission medications   Medication Sig Start Date End Date Taking? Authorizing Provider  cetirizine-pseudoephedrine (ZYRTEC-D) 5-120 MG tablet Take 1 tablet by mouth daily. 03/10/16   Bethel BornKelly Marie Omayra Tulloch, PA-C  fluticasone (FLONASE) 50 MCG/ACT nasal spray Place 2 sprays into both nostrils daily. 03/10/16   Bethel BornKelly Marie Kepler Mccabe, PA-C  glimepiride (AMARYL) 2 MG tablet Take 2 mg by mouth daily with breakfast.    Historical Provider, MD  metFORMIN (GLUCOPHAGE) 500 MG tablet Take 500 mg by mouth daily with breakfast.    Historical Provider, MD    Family History Family History  Problem Relation Age of Onset  . Diabetes Other   . Cancer Other   . Hypertension Other     Social History Social History  Substance Use Topics  . Smoking status: Never Smoker  . Smokeless tobacco: Never Used  . Alcohol use No     Allergies   Patient has no known allergies.   Review of Systems Review of Systems  Constitutional: Negative for chills and fever.  HENT: Positive for congestion, rhinorrhea and sore throat. Negative for ear pain and trouble swallowing.   Eyes: Negative for redness.  Respiratory:  Negative for cough and shortness of breath.   Cardiovascular: Negative for chest pain.  Gastrointestinal: Negative for abdominal pain, nausea and vomiting.  All other systems reviewed and are negative.    Physical Exam Updated Vital Signs BP 141/91 (BP Location: Left Arm)   Pulse 74   Temp 98 F (36.7 C) (Oral)   Resp 20   Ht 5\' 9"  (1.753 m)   Wt 119.7 kg   SpO2 99%   BMI 38.99 kg/m   Physical Exam  Constitutional: He is oriented to person, place, and time. He appears well-developed and well-nourished. No distress.  HENT:  Head: Normocephalic and atraumatic.  Right Ear: Hearing, tympanic membrane, external ear and ear canal normal.  Left Ear: Hearing, tympanic membrane, external ear and ear canal normal.  Nose: Mucosal edema and rhinorrhea present. Right sinus exhibits no maxillary sinus tenderness and no frontal sinus tenderness. Left sinus exhibits no maxillary sinus tenderness and no frontal sinus tenderness.  Mouth/Throat: Uvula is midline, oropharynx is clear and moist and mucous membranes are normal.  Eyes: Conjunctivae are normal. Pupils are equal, round, and reactive to light. Right eye exhibits no discharge. Left eye exhibits no discharge. No scleral icterus.  Neck: Normal range of motion.  Cardiovascular: Normal rate and regular rhythm.  Exam reveals no gallop and no friction rub.   No murmur heard. Pulmonary/Chest: Effort normal and breath sounds normal. No respiratory  distress. He has no wheezes. He has no rales. He exhibits no tenderness.  Abdominal: He exhibits no distension.  Neurological: He is alert and oriented to person, place, and time.  Skin: Skin is warm and dry.  Psychiatric: He has a normal mood and affect. His behavior is normal.  Nursing note and vitals reviewed.    ED Treatments / Results  Labs (all labs ordered are listed, but only abnormal results are displayed) Labs Reviewed  RAPID STREP SCREEN (NOT AT [facility])  CULTURE, GROUP A STREP [facility]     EKG  EKG Interpretation None       Radiology No results found.  Procedures Procedures (including critical care time)  Medications Ordered in ED Medications - No data to display   Initial Impression / Assessment and Plan / ED Course  I have reviewed the triage vital signs and the nursing notes.  Pertinent labs & imaging results that were available during my care of the patient were reviewed by me and considered in my medical decision making (see chart for details).  Clinical Course    40 year old male with URI. Patient is afebrile, not tachycardic or tachypneic, and not hypoxic. He is mildly hypertensive. Rapid strep is negative. Exam is unrevealing. Advised supportive care including decongestant and flonase. Patient is NAD, non-toxic, with stable VS. Patient is informed of clinical course, understands medical decision making process, and agrees with plan. Opportunity for questions provided and all questions answered. Return precautions given.   Final Clinical Impressions(s) / ED Diagnoses   Final diagnoses:  Viral URI with cough    New Prescriptions Discharge Medication List as of 03/10/2016  1:15 PM    START taking these medications   Details  cetirizine-pseudoephedrine (ZYRTEC-D) 5-120 MG tablet Take 1 tablet by mouth daily., Starting Wed 03/10/2016, Print    fluticasone (FLONASE) 50 MCG/ACT nasal spray Place 2 sprays into both nostrils daily., Starting Wed 03/10/2016, Print         Bethel BornKelly Marie Emarie Paul, PA-C 03/10/16 1510    Jerelyn ScottMartha Linker, MD 03/10/16 [phone]

## 2016-03-12 LAB — CULTURE, GROUP A STREP (THRC)

## 2016-04-26 ENCOUNTER — Encounter (HOSPITAL_COMMUNITY): Payer: Self-pay | Admitting: Emergency Medicine

## 2016-04-26 ENCOUNTER — Observation Stay (HOSPITAL_COMMUNITY)
Admission: EM | Admit: 2016-04-26 | Discharge: 2016-04-27 | Disposition: A | Discharge status: Home / Self Care | Payer: BLUE CROSS/BLUE SHIELD | Attending: Internal Medicine | Admitting: Internal Medicine

## 2016-04-26 ENCOUNTER — Observation Stay (HOSPITAL_COMMUNITY): Payer: BLUE CROSS/BLUE SHIELD

## 2016-04-26 ENCOUNTER — Emergency Department (HOSPITAL_COMMUNITY): Payer: BLUE CROSS/BLUE SHIELD

## 2016-04-26 DIAGNOSIS — E119 Type 2 diabetes mellitus without complications: Secondary | ICD-10-CM | POA: Diagnosis not present

## 2016-04-26 DIAGNOSIS — Z7984 Long term (current) use of oral hypoglycemic drugs: Secondary | ICD-10-CM | POA: Diagnosis not present

## 2016-04-26 DIAGNOSIS — G459 Transient cerebral ischemic attack, unspecified: Principal | ICD-10-CM | POA: Diagnosis present

## 2016-04-26 HISTORY — DX: Transient cerebral ischemic attack, unspecified: G45.9

## 2016-04-26 LAB — COMPREHENSIVE METABOLIC PANEL
ALBUMIN: 4.2 g/dL (ref 3.5–5.0)
ALT: 28 U/L (ref 17–63)
AST: 25 U/L (ref 15–41)
Alkaline Phosphatase: 71 U/L (ref 38–126)
Anion gap: 11 (ref 5–15)
BUN: 14 mg/dL (ref 6–20)
CHLORIDE: 104 mmol/L (ref 101–111)
CO2: 21 mmol/L — AB (ref 22–32)
Calcium: 9.1 mg/dL (ref 8.9–10.3)
Creatinine, Ser: 0.74 mg/dL (ref 0.61–1.24)
GFR calc Af Amer: >60 mL/min (ref >60)
Glucose, Bld: 333 mg/dL — ABNORMAL HIGH (ref 65–99)
POTASSIUM: 3.8 mmol/L (ref 3.5–5.1)
Sodium: 136 mmol/L (ref 135–145)
Total Bilirubin: 0.7 mg/dL (ref 0.3–1.2)
Total Protein: 7.2 g/dL (ref 6.5–8.1)

## 2016-04-26 LAB — DIFFERENTIAL
BASOS ABS: 0.1 10*3/uL (ref 0.0–0.1)
Basophils Relative: 1 %
EOS ABS: 0.1 10*3/uL (ref 0.0–0.7)
Eosinophils Relative: 1 %
LYMPHS PCT: 28 %
Lymphs Abs: 2 10*3/uL (ref 0.7–4.0)
MONO ABS: 0.5 10*3/uL (ref 0.1–1.0)
Monocytes Relative: 7 %
NEUTROS PCT: 63 %
Neutro Abs: 4.4 10*3/uL (ref 1.7–7.7)

## 2016-04-26 LAB — I-STAT CHEM 8, ED
BUN: 13 mg/dL (ref 6–20)
CALCIUM ION: 1.08 mmol/L — AB (ref 1.15–1.40)
CHLORIDE: 102 mmol/L (ref 101–111)
CREATININE: 0.6 mg/dL — AB (ref 0.61–1.24)
GLUCOSE: 342 mg/dL — AB (ref 65–99)
HCT: 48 % (ref 39.0–52.0)
Hemoglobin: 16.3 g/dL (ref 13.0–17.0)
Potassium: 3.8 mmol/L (ref 3.5–5.1)
Sodium: 138 mmol/L (ref 135–145)
TCO2: 20 mmol/L (ref 0–100)

## 2016-04-26 LAB — CREATININE, SERUM
Creatinine, Ser: 0.62 mg/dL (ref 0.61–1.24)
GFR calc non Af Amer: >60 mL/min (ref >60)

## 2016-04-26 LAB — CBC
HCT: 45.2 % (ref 39.0–52.0)
HEMATOCRIT: 45.8 % (ref 39.0–52.0)
Hemoglobin: 17 g/dL (ref 13.0–17.0)
Hemoglobin: 16.9 g/dL (ref 13.0–17.0)
MCH: 30.5 pg (ref 26.0–34.0)
MCH: 29.9 pg (ref 26.0–34.0)
MCHC: 37.6 g/dL — ABNORMAL HIGH (ref 30.0–36.0)
MCHC: 36.9 g/dL — ABNORMAL HIGH (ref 30.0–36.0)
MCV: 81 fL (ref 78.0–100.0)
MCV: 80.9 fL (ref 78.0–100.0)
PLATELETS: 241 10*3/uL (ref 150–400)
Platelets: 218 10*3/uL (ref 150–400)
RBC: 5.58 MIL/uL (ref 4.22–5.81)
RBC: 5.66 MIL/uL (ref 4.22–5.81)
RDW: 12.7 % (ref 11.5–15.5)
RDW: 12.6 % (ref 11.5–15.5)
WBC: 7.1 10*3/uL (ref 4.0–10.5)
WBC: 7.5 10*3/uL (ref 4.0–10.5)

## 2016-04-26 LAB — CBG MONITORING, ED: Glucose-Capillary: 233 mg/dL — ABNORMAL HIGH (ref 65–99)

## 2016-04-26 LAB — PROTIME-INR
INR: 0.98
PROTHROMBIN TIME: 13 s (ref 11.4–15.2)

## 2016-04-26 LAB — GLUCOSE, CAPILLARY: GLUCOSE-CAPILLARY: 152 mg/dL — AB (ref 65–99)

## 2016-04-26 LAB — I-STAT TROPONIN, ED: TROPONIN I, POC: 0 ng/mL (ref 0.00–0.08)

## 2016-04-26 LAB — APTT: aPTT: 25 seconds (ref 24–36)

## 2016-04-26 MED ORDER — GADOBENATE DIMEGLUMINE 529 MG/ML IV SOLN
20.0000 mL | Freq: Once | INTRAVENOUS | Status: AC | PRN
  Administered 2016-04-26: 20 mL via INTRAVENOUS

## 2016-04-26 MED ORDER — ENOXAPARIN SODIUM 40 MG/0.4ML ~~LOC~~ SOLN
40.0000 mg | SUBCUTANEOUS | Status: DC
  Filled 2016-04-26: qty 0.4

## 2016-04-26 MED ORDER — LORAZEPAM 2 MG/ML IJ SOLN
1.0000 mg | Freq: Once | INTRAMUSCULAR | Status: AC
  Administered 2016-04-26: 1 mg via INTRAVENOUS
  Filled 2016-04-26: qty 1

## 2016-04-26 MED ORDER — OXYCODONE-ACETAMINOPHEN 10-325 MG PO TABS: 1.0000 | ORAL_TABLET | Freq: Every day | ORAL | Status: DC

## 2016-04-26 MED ORDER — ASPIRIN 325 MG PO TABS
325.0000 mg | ORAL_TABLET | Freq: Every day | ORAL | Status: DC
  Administered 2016-04-27: 325 mg via ORAL
  Filled 2016-04-26: qty 1

## 2016-04-26 MED ORDER — OXYCODONE HCL 5 MG PO TABS
5.0000 mg | ORAL_TABLET | Freq: Every day | ORAL | Status: DC
  Administered 2016-04-26: 5 mg via ORAL
  Filled 2016-04-26: qty 1

## 2016-04-26 MED ORDER — ASPIRIN 325 MG PO TABS
325.0000 mg | ORAL_TABLET | Freq: Once | ORAL | Status: AC
  Administered 2016-04-26: 325 mg via ORAL
  Filled 2016-04-26: qty 1

## 2016-04-26 MED ORDER — SENNOSIDES-DOCUSATE SODIUM 8.6-50 MG PO TABS: 1.0000 | ORAL_TABLET | Freq: Every evening | ORAL | Status: DC | PRN

## 2016-04-26 MED ORDER — ACETAMINOPHEN 325 MG PO TABS: 650.0000 mg | ORAL_TABLET | ORAL | Status: DC | PRN

## 2016-04-26 MED ORDER — INSULIN ASPART 100 UNIT/ML ~~LOC~~ SOLN
0.0000 [IU] | Freq: Three times a day (TID) | SUBCUTANEOUS | Status: DC
  Administered 2016-04-27: 2 [IU] via SUBCUTANEOUS

## 2016-04-26 MED ORDER — STROKE: EARLY STAGES OF RECOVERY BOOK
Freq: Once | Status: DC
  Filled 2016-04-26: qty 1

## 2016-04-26 MED ORDER — PRAVASTATIN SODIUM 40 MG PO TABS
40.0000 mg | ORAL_TABLET | Freq: Every day | ORAL | Status: DC
  Administered 2016-04-26: 40 mg via ORAL
  Filled 2016-04-26: qty 1

## 2016-04-26 MED ORDER — INSULIN ASPART 100 UNIT/ML ~~LOC~~ SOLN: 0.0000 [IU] | Freq: Every day | SUBCUTANEOUS | Status: DC

## 2016-04-26 MED ORDER — ACETAMINOPHEN 160 MG/5ML PO SOLN: 650.0000 mg | ORAL | Status: DC | PRN

## 2016-04-26 MED ORDER — ACETAMINOPHEN 650 MG RE SUPP: 650.0000 mg | RECTAL | Status: DC | PRN

## 2016-04-26 MED ORDER — OXYCODONE-ACETAMINOPHEN 5-325 MG PO TABS
1.0000 | ORAL_TABLET | Freq: Every day | ORAL | Status: DC
  Administered 2016-04-26: 1 via ORAL
  Filled 2016-04-26: qty 1

## 2016-04-26 NOTE — ED Notes (Signed)
Pt in MRI. Unable to draw blood or administer medications.

## 2016-04-26 NOTE — ED Triage Notes (Signed)
Patient states that when he woke up around 630am today he was dizzy, nauseated and had slurred speech. Patient states that he went on to work and called [employer].  Patient states that symptoms have gone away.

## 2016-04-26 NOTE — ED Notes (Signed)
Patient transported to MRI 

## 2016-04-26 NOTE — H&P (Signed)
History and Physical    Dale Reed Leblond ZOX:[id number] DOB: 01/13/1976  DOA: 04/26/2016 PCP: Burtis JunesBLOUNT,ALVIN VINCENT, MD (Inactive)  Patient coming from: Home  Chief Complaint: Slurred speech   HPI: Dale Reed Urich is a 41 y.o. male with medical history significant of diabetes mellitus type 2, present to the density department complaining of slurred speech and weakness. Patient reported symptoms started after he wake up he fell that his words were slurring. Secondly he went to work and started feeling weak and malaise. Patient was brought to the ED for further evaluation. He denies unilateral weakness, numbness, tingling, change in vision or hearing loss. No dizziness, chest pain, palpitations, shortness of breath or abdominal pain. Symptoms resolve in the ED. Patient reported that he never had an episode like this before.  ED Course: Only significant labs glucose of 300s, CT head with no acute changes. Asked to admit for evaluation of TIA and further workup. EDP consulted neurology which [facility].  Review of Systems:   General: no changes in body weight, no fever chills or decrease in energy.  HEENT: no blurry vision, hearing changes or sore throat Respiratory: no dyspnea, coughing, wheezing CV: no chest pain, no palpitations GI: no nausea, vomiting, abdominal pain, diarrhea, constipation GU: no dysuria, burning on urination, increased urinary frequency, hematuria  Ext:. No deformities,  Neuro: See history of present illness Skin: No rashes, lesions or wounds. MSK: No muscle spasm, no deformity, no limitation of range of movement in spin Heme: No easy bruising.  Travel history: No recent long distant travel.   Past Medical History:  Diagnosis Date  . Back pain   . Diabetes mellitus     Past Surgical History:  Procedure Laterality Date  . APPENDECTOMY       reports that he has never smoked. He has never used smokeless tobacco. He  reports that he does not drink alcohol or use drugs.  No Known Allergies  Family History  Problem Relation Age of Onset  . Diabetes Other   . Cancer Other   . Hypertension Other    Family history reviewed and noncontributory  Prior to Admission medications   Medication Sig Start Date End Date Taking? Authorizing Provider  glipiZIDE (GLUCOTROL) 10 MG tablet Take 10 mg by mouth 2 (two) times daily before a meal.   Yes Historical Provider, MD  lovastatin (MEVACOR) 40 MG tablet Take 40 mg by mouth at bedtime.   Yes Historical Provider, MD  metFORMIN (GLUCOPHAGE) 1000 MG tablet Take 1,000 mg by mouth 2 (two) times daily with a meal.   Yes Historical Provider, MD  oxyCODONE-acetaminophen (PERCOCET) 10-325 MG tablet Take 1 tablet by mouth at bedtime.   Yes Historical Provider, MD    Physical Exam: Vitals:   04/26/16 1043 04/26/16 1524 04/26/16 1526 04/26/16 1716  BP:  132/80  140/81  Pulse:  93  80  Resp:  18 18   Temp:  97.9 F (36.6 C)    TempSrc:  Oral    SpO2:  98%  100%  Weight: 117.9 kg (260 lb)     Height: 5\' 10"  (1.778 m)        Constitutional: NAD, calm, comfortable Eyes: PERRL, lids and conjunctivae normal ENMT: Mucous membranes are moist. Posterior pharynx clear of any exudate or lesions.Normal dentition.  Neck: normal, supple, no masses, no thyromegaly Respiratory: clear to auscultation bilaterally, no wheezing, no crackles. Normal respiratory effort. No accessory muscle use.  Cardiovascular: Regular rate and rhythm,  no murmurs / rubs / gallops. No extremity edema. 2+ pedal pulses. No carotid bruits.  Abdomen: no tenderness, no masses palpated. No hepatosplenomegaly. Bowel sounds positive.  Musculoskeletal: no clubbing / cyanosis. No joint deformity upper and lower extremities. Good ROM, no contractures. Normal muscle tone.  Skin: no rashes, lesions, ulcers. No induration Neurologic: CN 2-12 grossly intact. Sensation intact, DTR normal. Strength 5/5 in all 4.    Psychiatric: Normal judgment and insight. Alert and oriented x 3. Normal mood.    Labs on Admission: I have personally reviewed following labs and imaging studies  CBC:  Recent Labs Lab 04/26/16 1119 04/26/16 1128  WBC 7.1  --   NEUTROABS 4.4  --   HGB 17.0 16.3  HCT 45.2 48.0  MCV 81.0  --   PLT 241  --    Basic Metabolic Panel:  Recent Labs Lab 04/26/16 1119 04/26/16 1128  NA 136 138  K 3.8 3.8  CL 104 102  CO2 21*  --   GLUCOSE 333* 342*  BUN 14 13  CREATININE 0.74 0.60*  CALCIUM 9.1  --    GFR: Estimated Creatinine Clearance: 154.8 mL/min (by C-G formula based on SCr of 0.6 mg/dL (L)). Liver Function Tests:  Recent Labs Lab 04/26/16 1119  AST 25  ALT 28  ALKPHOS 71  BILITOT 0.7  PROT 7.2  ALBUMIN 4.2   Coagulation Profile:  Recent Labs Lab 04/26/16 1119  INR 0.98   CBG:  Recent Labs Lab 04/26/16 1310  GLUCAP 233*   Urine analysis:    Component Value Date/Time   COLORURINE YELLOW 08/29/2015 0649   APPEARANCEUR CLEAR 08/29/2015 0649   LABSPEC 1.015 08/29/2015 0649   PHURINE 7.0 08/29/2015 0649   GLUCOSEU >1000 (A) 08/29/2015 0649   HGBUR NEGATIVE 08/29/2015 0649   BILIRUBINUR NEGATIVE 08/29/2015 0649   KETONESUR 15 (A) 08/29/2015 0649   PROTEINUR NEGATIVE 08/29/2015 0649   NITRITE NEGATIVE 08/29/2015 0649   LEUKOCYTESUR NEGATIVE 08/29/2015 0649    Radiological Exams on Admission: Ct Head Wo Contrast  Result Date: 04/26/2016 CLINICAL DATA:  Dizziness, nausea, slurred speech EXAM: CT HEAD WITHOUT CONTRAST TECHNIQUE: Contiguous axial images were obtained from the base of the skull through the vertex without intravenous contrast. COMPARISON:  None. FINDINGS: Brain: No intracranial hemorrhage, mass effect or midline shift. No acute cortical infarction. No mass lesion is noted on this unenhanced scan. The gray and white-matter differentiation is preserved no hydrocephalus. Vascular: No hyperdense vessel or unexpected calcification. Skull:  Normal. Negative for fracture or focal lesion. Sinuses/Orbits: No acute finding. Other: None IMPRESSION: No acute intracranial abnormality. No definite acute cortical infarction Electronically Signed   By: Natasha Mead M.D.   On: 04/26/2016 12:03    EKG: Independently reviewed. Normal sinus rhythm, poor R-wave progression, no ST segment changes.  Assessment/Plan TIA - risk factor diabetes mellitus and obesity. Symptoms has resolved at this point. NIHS score 0  Would admit to telemetry for further workup MRI/MRA, echo, lipid panel, TSH and A1c Neurology consult Transfer to [facility] for further management.  Type 2 diabetes mellitus - patient on glipizide and metformin at home We will hold home medication SSI Monitor CBG   DVT prophylaxis: Lovenox Code Status: Full Family Communication: Family at bedside Disposition Plan: Anticipate discharge to previous home environment.  Consults called: Neurology Admission status: Observation - transferred to [facility] for neurological evaluation.   Latrelle Dodrill MD [facility] Pager: Text Page via [email or web address]  [phone]  If 7PM-7AM, please contact night-coverage  [email or web address] Password [facility]  04/26/2016, 5:17 PM

## 2016-04-26 NOTE — ED Provider Notes (Signed)
[facility]-EMERGENCY DEPT Provider Note   CSN: [id number] Arrival date & time: 04/26/16  1035   History   Chief Complaint Chief Complaint  Patient presents with  . Aphasia  . Nausea    HPI Dale Reed is a 41 y.o. male.  HPI   41 year old male presents today with acute onset of slurred speech, dizziness. Patient notes that he woke up at 625 this morning, approximately 10 minutes after awakening he had acute onset of dizziness, slurred speech, difficulty speaking. He notes he felt off balance and had difficulty with walking. Patient reports generalized weakness. He notes he was able to continue on with his day-to-day activities was able to have breakfast. Patient notes symptoms lasted approximately 2 hours.  She denies any associated headache, unilateral weakness, decreased sensation. Patient is a history of diabetes currently taking metformin, no smoking history, no hypertension, no drugs or alcohol. No recent infections.   Past Medical History:  Diagnosis Date  . Back pain   . Diabetes mellitus     Patient Active Problem List   Diagnosis Date Noted  . TIA (transient ischemic attack) 04/26/2016  . Type 2 diabetes mellitus without complication, without long-term current use of insulin [facility])     Past Surgical History:  Procedure Laterality Date  . APPENDECTOMY       Home Medications    Prior to Admission medications   Medication Sig Start Date End Date Taking? Authorizing Provider  glipiZIDE (GLUCOTROL) 10 MG tablet Take 10 mg by mouth 2 (two) times daily before a meal.   Yes Historical Provider, MD  lovastatin (MEVACOR) 40 MG tablet Take 40 mg by mouth at bedtime.   Yes Historical Provider, MD  metFORMIN (GLUCOPHAGE) 1000 MG tablet Take 1,000 mg by mouth 2 (two) times daily with a meal.   Yes Historical Provider, MD  oxyCODONE-acetaminophen (PERCOCET) 10-325 MG tablet Take 1 tablet by mouth at bedtime.   Yes Historical Provider, MD    Family History Family History   Problem Relation Age of Onset  . Diabetes Other   . Cancer Other   . Hypertension Other     Social History Social History  Substance Use Topics  . Smoking status: Never Smoker  . Smokeless tobacco: Never Used  . Alcohol use No     Allergies   Patient has no known allergies.   Review of Systems Review of Systems  All other systems reviewed and are negative.  Physical Exam Updated Vital Signs BP 119/73 (BP Location: Left Arm)   Pulse 67   Temp 97.9 F (36.6 C) (Oral)   Resp 20   Ht 5\' 10"  (1.778 m)   Wt 119.9 kg   SpO2 99%   BMI 37.92 kg/m   Physical Exam  Constitutional: He is oriented to person, place, and time. He appears well-developed and well-nourished.  HENT:  Head: Normocephalic and atraumatic.  Eyes: Conjunctivae are normal. Pupils are equal, round, and reactive to light. Right eye exhibits no discharge. Left eye exhibits no discharge. No scleral icterus.  Neck: Normal range of motion. No JVD present. No tracheal deviation present.  Cardiovascular: Normal rate and regular rhythm.   Pulmonary/Chest: Effort normal. No stridor.  Neurological: He is alert and oriented to person, place, and time. He has normal strength. No cranial nerve deficit or sensory deficit. Coordination normal. GCS eye subscore is 4. GCS verbal subscore is 5. GCS motor subscore is 6.  Ambulates without diffuclty  Psychiatric: He has a normal mood and affect. His  behavior is normal. Judgment and thought content normal.  Nursing note and vitals reviewed.    ED Treatments / Results  Labs (all labs ordered are listed, but only abnormal results are displayed) Labs Reviewed  CBC - Abnormal; Notable for the following:       Result Value   MCHC 37.6 (*)    All other components within normal limits  COMPREHENSIVE METABOLIC PANEL - Abnormal; Notable for the following:    CO2 21 (*)    Glucose, Bld 333 (*)    All other components within normal limits  CBC - Abnormal; Notable for the  following:    MCHC 36.9 (*)    All other components within normal limits  GLUCOSE, CAPILLARY - Abnormal; Notable for the following:    Glucose-Capillary 152 (*)    All other components within normal limits  CBG MONITORING, ED - Abnormal; Notable for the following:    Glucose-Capillary 233 (*)    All other components within normal limits  I-STAT CHEM 8, ED - Abnormal; Notable for the following:    Creatinine, Ser 0.60 (*)    Glucose, Bld 342 (*)    Calcium, Ion 1.08 (*)    All other components within normal limits  PROTIME-INR  APTT  DIFFERENTIAL  CREATININE, SERUM  HEMOGLOBIN A1C  LIPID PANEL  TSH  I-STAT TROPOININ, ED    EKG  EKG Interpretation None       Radiology Ct Head Wo Contrast  Result Date: 04/26/2016 CLINICAL DATA:  Dizziness, nausea, slurred speech EXAM: CT HEAD WITHOUT CONTRAST TECHNIQUE: Contiguous axial images were obtained from the base of the skull through the vertex without intravenous contrast. COMPARISON:  None. FINDINGS: Brain: No intracranial hemorrhage, mass effect or midline shift. No acute cortical infarction. No mass lesion is noted on this unenhanced scan. The gray and white-matter differentiation is preserved no hydrocephalus. Vascular: No hyperdense vessel or unexpected calcification. Skull: Normal. Negative for fracture or focal lesion. Sinuses/Orbits: No acute finding. Other: None IMPRESSION: No acute intracranial abnormality. No definite acute cortical infarction Electronically Signed   By: Natasha MeadLiviu  Pop M.D.   On: 04/26/2016 12:03   Mr Brain W And Wo Contrast  Result Date: 04/26/2016 CLINICAL DATA:  Slurred speech and dizziness EXAM: MRI HEAD WITHOUT AND WITH CONTRAST TECHNIQUE: Multiplanar, multiecho pulse sequences of the brain and surrounding structures were obtained without and with intravenous contrast. CONTRAST:  20mL MULTIHANCE GADOBENATE DIMEGLUMINE 529 MG/ML IV SOLN COMPARISON:  Head CT 04/26/2016 FINDINGS: Brain: No focal diffusion  restriction to indicate acute infarct. No intraparenchymal hemorrhage. The brain parenchymal signal is normal. No mass lesion or midline shift. No hydrocephalus or extra-axial fluid collection. The midline structures are normal. No age advanced or lobar predominant atrophy. Vascular: Major intracranial arterial and venous sinus flow voids are preserved. No evidence of chronic microhemorrhage or amyloid angiopathy. Skull and upper cervical spine: The visualized skull base, calvarium, upper cervical spine and extracranial soft tissues are normal. Sinuses/Orbits: No fluid levels or advanced mucosal thickening. No mastoid effusion. Normal orbits. IMPRESSION: Normal MRI of the brain for age. Electronically Signed   By: Deatra RobinsonKevin  Herman M.D.   On: 04/26/2016 19:08    Procedures Procedures (including critical care time)  Medications Ordered in ED Medications  pravastatin (PRAVACHOL) tablet 40 mg (not administered)   stroke: mapping our early stages of recovery book (not administered)  acetaminophen (TYLENOL) tablet 650 mg (not administered)    Or  acetaminophen (TYLENOL) solution 650 mg (not administered)  Or  acetaminophen (TYLENOL) suppository 650 mg (not administered)  senna-docusate (Senokot-S) tablet 1 tablet (not administered)  enoxaparin (LOVENOX) injection 40 mg (not administered)  aspirin tablet 325 mg (not administered)  insulin aspart (novoLOG) injection 0-15 Units (not administered)  insulin aspart (novoLOG) injection 0-5 Units (not administered)  oxyCODONE-acetaminophen (PERCOCET/ROXICET) 5-325 MG per tablet 1 tablet (not administered)    And  oxyCODONE (Oxy IR/ROXICODONE) immediate release tablet 5 mg (not administered)  aspirin tablet 325 mg (325 mg Oral Given 04/26/16 1522)  LORazepam (ATIVAN) injection 1 mg (1 mg Intravenous Given 04/26/16 1742)  gadobenate dimeglumine (MULTIHANCE) injection 20 mL (20 mLs Intravenous Contrast Given 04/26/16 1822)     Initial Impression / Assessment  and Plan / ED Course  I have reviewed the triage vital signs and the nursing notes.  Pertinent labs & imaging results that were available during my care of the patient were reviewed by me and considered in my medical decision making (see chart for details).       Final Clinical Impressions(s) / ED Diagnoses   Final diagnoses:  Transient cerebral ischemia, unspecified type    Labs: CBG, I stat chem 8, I stat troponin, PTINR, APTT< CBC, Diff, CMP,   Imaging: CT Head wo  Consults:  Therapeutics:  Discharge Meds:   Assessment/Plan:41 year old male presents today with findings concerning for TIA. Patient has a score of ABCD score 4.I spoke with neurologist Dr. Benedict Needy who agreed that admission for TIA work-up would be most appropriate for this patient. MRI w/wo ordered here in the ED, Neuro to consult. [facility] consulted for admission.    New Prescriptions Current Discharge Medication List         Eyvonne Mechanic, PA-C 04/26/16 2239    Cathren Laine, MD 04/28/16 1459

## 2016-04-27 ENCOUNTER — Observation Stay (HOSPITAL_BASED_OUTPATIENT_CLINIC_OR_DEPARTMENT_OTHER): Payer: BLUE CROSS/BLUE SHIELD

## 2016-04-27 DIAGNOSIS — G458 Other transient cerebral ischemic attacks and related syndromes: Secondary | ICD-10-CM

## 2016-04-27 DIAGNOSIS — G459 Transient cerebral ischemic attack, unspecified: Secondary | ICD-10-CM

## 2016-04-27 LAB — GLUCOSE, CAPILLARY
Glucose-Capillary: 191 mg/dL — ABNORMAL HIGH (ref 65–99)
Glucose-Capillary: 232 mg/dL — ABNORMAL HIGH (ref 65–99)

## 2016-04-27 LAB — ECHOCARDIOGRAM COMPLETE
HEIGHTINCHES: 70 in
Weight: 4228.8 oz

## 2016-04-27 MED ORDER — PERFLUTREN LIPID MICROSPHERE
INTRAVENOUS | Status: AC
  Administered 2016-04-27: 2 mL via INTRAVENOUS
  Filled 2016-04-27: qty 10

## 2016-04-27 MED ORDER — PERFLUTREN LIPID MICROSPHERE
1.0000 mL | INTRAVENOUS | Status: AC | PRN
  Administered 2016-04-27: 2 mL via INTRAVENOUS
  Filled 2016-04-27: qty 10

## 2016-04-27 NOTE — Discharge Summary (Signed)
Physician Discharge Summary  Dale Reed OZH:[id number] DOB: 09/06/1975 DOA: 04/26/2016  PCP: Dale JunesBLOUNT,ALVIN VINCENT, MD (Inactive)  Admit date: 04/26/2016 Discharge date: 04/27/2016  Recommendations for Outpatient Follow-up:  1. No changes in medications on discharge  Discharge Diagnoses:  Active Problems:   TIA (transient ischemic attack)    Discharge Condition: stable   Diet recommendation: as tolerated   History of present illness:  41 year old male with past medical history significant for diabetes who presented to ED with weakness, lightheadedness, dizziness and slurred speech started when he woke up the morning of the admission. No acute intracranial findings and CT head. He was admitted for TIA workup.  Hospital Course:   Active Problems:   TIA (transient ischemic attack) - MRI with no evidence of stroke - No further symptoms of lightheadedness or dizziness     Diabetes mellitus without complications without long-term insulin use - Continue glipizide and metformin   Signed:  Manson PasseyEVINE, Agnieszka Newhouse, MD  [facility] 04/27/2016, 11:56 AM  Pager #: [phone]  Time spent in minutes: less than 30 minutes  Procedures:  ECHO  Consultations:  None   Discharge Exam: Vitals:   04/27/16 0635 04/27/16 0828  BP: 109/67 119/68  Pulse: 70 79  Resp: 20 18  Temp: 98.1 F (36.7 C)    Vitals:   04/27/16 0235 04/27/16 0435 04/27/16 0635 04/27/16 0828  BP: 112/65 99/74 109/67 119/68  Pulse: 76 69 70 79  Resp: 20 18 20 18   Temp: 97.9 F (36.6 C) 98.1 F (36.7 C) 98.1 F (36.7 C)   TempSrc: Oral Oral Oral   SpO2: 98% 97% 97% 98%  Weight:      Height:        General: Pt is alert, follows commands appropriately, not in acute distress Cardiovascular: Regular rate and rhythm, S1/S2 +, no murmurs Respiratory: Clear to auscultation bilaterally, no wheezing, no crackles, no rhonchi Abdominal: Soft, non tender, non distended, bowel sounds +, no guarding Extremities:  no edema, no cyanosis, pulses palpable bilaterally DP and PT Neuro: Grossly nonfocal  Discharge Instructions  Discharge Instructions    Call MD for:  persistant nausea and vomiting    Complete by:  As directed    Call MD for:  redness, tenderness, or signs of infection (pain, swelling, redness, odor or green/yellow discharge around incision site)    Complete by:  As directed    Call MD for:  severe uncontrolled pain    Complete by:  As directed    Diet - low sodium heart healthy    Complete by:  As directed    Increase activity slowly    Complete by:  As directed      Allergies as of 04/27/2016   No Known Allergies     Medication List    TAKE these medications   glipiZIDE 10 MG tablet Commonly known as:  GLUCOTROL Take 10 mg by mouth 2 (two) times daily before a meal.   lovastatin 40 MG tablet Commonly known as:  MEVACOR Take 40 mg by mouth at bedtime.   metFORMIN 1000 MG tablet Commonly known as:  GLUCOPHAGE Take 1,000 mg by mouth 2 (two) times daily with a meal.   oxyCODONE-acetaminophen 10-325 MG tablet Commonly known as:  PERCOCET Take 1 tablet by mouth at bedtime.      Follow-up Information    Dale JunesBLOUNT,ALVIN VINCENT, MD. Schedule an appointment as soon as possible for a visit in 2 week(s).   Specialty:  Family Medicine  The results of significant diagnostics from this hospitalization (including imaging, microbiology, ancillary and laboratory) are listed below for reference.    Significant Diagnostic Studies: Ct Head Wo Contrast  Result Date: 04/26/2016 CLINICAL DATA:  Dizziness, nausea, slurred speech EXAM: CT HEAD WITHOUT CONTRAST TECHNIQUE: Contiguous axial images were obtained from the base of the skull through the vertex without intravenous contrast. COMPARISON:  None. FINDINGS: Brain: No intracranial hemorrhage, mass effect or midline shift. No acute cortical infarction. No mass lesion is noted on this unenhanced scan. The gray and  white-matter differentiation is preserved no hydrocephalus. Vascular: No hyperdense vessel or unexpected calcification. Skull: Normal. Negative for fracture or focal lesion. Sinuses/Orbits: No acute finding. Other: None IMPRESSION: No acute intracranial abnormality. No definite acute cortical infarction Electronically Signed   By: Natasha Mead M.D.   On: 04/26/2016 12:03   Mr Brain W And Wo Contrast  Result Date: 04/26/2016 CLINICAL DATA:  Slurred speech and dizziness EXAM: MRI HEAD WITHOUT AND WITH CONTRAST TECHNIQUE: Multiplanar, multiecho pulse sequences of the brain and surrounding structures were obtained without and with intravenous contrast. CONTRAST:  20mL MULTIHANCE GADOBENATE DIMEGLUMINE 529 MG/ML IV SOLN COMPARISON:  Head CT 04/26/2016 FINDINGS: Brain: No focal diffusion restriction to indicate acute infarct. No intraparenchymal hemorrhage. The brain parenchymal signal is normal. No mass lesion or midline shift. No hydrocephalus or extra-axial fluid collection. The midline structures are normal. No age advanced or lobar predominant atrophy. Vascular: Major intracranial arterial and venous sinus flow voids are preserved. No evidence of chronic microhemorrhage or amyloid angiopathy. Skull and upper cervical spine: The visualized skull base, calvarium, upper cervical spine and extracranial soft tissues are normal. Sinuses/Orbits: No fluid levels or advanced mucosal thickening. No mastoid effusion. Normal orbits. IMPRESSION: Normal MRI of the brain for age. Electronically Signed   By: Deatra Robinson M.D.   On: 04/26/2016 19:08    Microbiology: No results found for this or any previous visit (from the past 240 hour(s)).   Labs: Basic Metabolic Panel:  Recent Labs Lab 04/26/16 1119 04/26/16 1128 04/26/16 1926  NA 136 138  --   K 3.8 3.8  --   CL 104 102  --   CO2 21*  --   --   GLUCOSE 333* 342*  --   BUN 14 13  --   CREATININE 0.74 0.60* 0.62  CALCIUM 9.1  --   --    Liver Function  Tests:  Recent Labs Lab 04/26/16 1119  AST 25  ALT 28  ALKPHOS 71  BILITOT 0.7  PROT 7.2  ALBUMIN 4.2   No results for input(s): LIPASE, AMYLASE in the last 168 hours. No results for input(s): AMMONIA in the last 168 hours. CBC:  Recent Labs Lab 04/26/16 1119 04/26/16 1128 04/26/16 1926  WBC 7.1  --  7.5  NEUTROABS 4.4  --   --   HGB 17.0 16.3 16.9  HCT 45.2 48.0 45.8  MCV 81.0  --  80.9  PLT 241  --  218   Cardiac Enzymes: No results for input(s): CKTOTAL, CKMB, CKMBINDEX, TROPONINI in the last 168 hours. BNP: BNP (last 3 results) No results for input(s): BNP in the last 8760 hours.  ProBNP (last 3 results) No results for input(s): PROBNP in the last 8760 hours.  CBG:  Recent Labs Lab 04/26/16 1310 04/26/16 2055 04/27/16 0632 04/27/16 1114  GLUCAP 233* 152* 191* 232*

## 2016-04-27 NOTE — Evaluation (Signed)
Physical Therapy Evaluation Patient Details Name: Dale Reed MRN: [id number] DOB: [birth year] Today's Date: 04/27/2016   History of Present Illness  Pt is a 41 y.o. male who presented to the ED with slurred speech, weakness, and malaise. CT and MRI negative for acute abnormalities. PMH significant for back pain and diabetes mellitus type 2.  Clinical Impression  Pt is at baseline level of functioning.  No balance issues.  Pt scored 56/56 on BERG.  Talked to pt about diet and exercise - lifestyle changes to help blood sugar readings and increasing activity level.  All education complete.  Pt VU.     Follow Up Recommendations No PT follow up    Equipment Recommendations  None recommended by PT    Recommendations for Other Services       Precautions / Restrictions Precautions Precautions: None Restrictions Weight Bearing Restrictions: No      Mobility  Bed Mobility Overal bed mobility: Independent                Transfers Overall transfer level: Independent Equipment used: None                Ambulation/Gait Ambulation/Gait assistance: Independent Ambulation Distance (Feet): 300 Feet Assistive device: None Gait Pattern/deviations: WFL(Within Functional Limits)        Stairs            Wheelchair Mobility    Modified Rankin (Stroke Patients Only)       Balance Overall balance assessment: No apparent balance deficits (not formally assessed)                               Standardized Balance Assessment Standardized Balance Assessment : Berg Balance Test Berg Balance Test Sit to Stand: Able to stand without using hands and stabilize independently Standing Unsupported: Able to stand safely 2 minutes Sitting with Back Unsupported but Feet Supported on Floor or Stool: Able to sit safely and securely 2 minutes Stand to Sit: Sits safely with minimal use of hands Transfers: Able to transfer safely, minor use of hands Standing  Unsupported with Eyes Closed: Able to stand 10 seconds safely Standing Ubsupported with Feet Together: Able to place feet together independently and stand 1 minute safely From Standing, Reach Forward with Outstretched Arm: Can reach confidently >25 cm (10") From Standing Position, Pick up Object from Floor: Able to pick up shoe safely and easily From Standing Position, Turn to Look Behind Over each Shoulder: Looks behind from both sides and weight shifts well Turn 360 Degrees: Able to turn 360 degrees safely in 4 seconds or less Standing Unsupported, Alternately Place Feet on Step/Stool: Able to stand independently and safely and complete 8 steps in 20 seconds Standing Unsupported, One Foot in Front: Able to place foot tandem independently and hold 30 seconds Standing on One Leg: Able to lift leg independently and hold > 10 seconds Total Score: 56         Pertinent Vitals/Pain Pain Assessment: No/denies pain    Home Living Family/patient expects to be discharged to:: Private residence Living Arrangements: Parent Available Help at Discharge: Family Type of Home: House         Home Equipment: None      Prior Function Level of Independence: Independent         Comments: Works [employer]. - at [employer]     Hand Dominance   Dominant Hand: Right    Extremity/Trunk  Assessment   Upper Extremity Assessment Upper Extremity Assessment: Defer to OT evaluation    Lower Extremity Assessment Lower Extremity Assessment: Overall WFL for tasks assessed    Cervical / Trunk Assessment Cervical / Trunk Assessment: Normal  Communication   Communication: No difficulties  Cognition Arousal/Alertness: Awake/alert Behavior During Therapy: WFL for tasks assessed/performed Overall Cognitive Status: Within Functional Limits for tasks assessed Area of Impairment: Memory Orientation Level:  (See comments section)   Memory: Decreased short-term memory          General Comments: Slight cognitive limitations impacting safety with IADL and work related tasks. Administered MOCA original version with pt scoring 25/30 (26/30 or greater considered normal score). Pt with most difficutly with delayed recall, sentence repetition, and verbal fluency. Also pt required increased time and used verbal problem solving skills to determine the month.    General Comments General comments (skin integrity, edema, etc.): no balance deficits identified.  talked to pt about getting on an endurance program - walking, riding stationary bikes etc.  Pt has access to a gym through his work    Exercises     Assessment/Plan    PT Assessment Patent does not need any further PT services  PT Problem List            PT Treatment Interventions      PT Goals (Current goals can be found in the Care Plan section)  Acute Rehab PT Goals Patient Stated Goal: to go home today    Frequency     Barriers to discharge        Co-evaluation               End of Session Equipment Utilized During Treatment: Gait belt Activity Tolerance: Patient tolerated treatment well Patient left: in bed Nurse Communication: Mobility status    Functional Assessment Tool Used: clinical observation Functional Limitation: Mobility: Walking and moving around Mobility: Walking and Moving Around Current Status [phone](G8978): 0 percent impaired, limited or restricted Mobility: Walking and Moving Around Goal Status [phone](G8979): 0 percent impaired, limited or restricted Mobility: Walking and Moving Around Discharge Status [phone](G8980): 0 percent impaired, limited or restricted    Time: 1120-1135 PT Time Calculation (min) (ACUTE ONLY): 15 min   Charges:   PT Evaluation $PT Eval Low Complexity: 1 Procedure PT Treatments $Gait Training: 8-22 mins   PT G Codes:   PT G-Codes **NOT FOR INPATIENT CLASS** Functional Assessment Tool Used: clinical observation Functional Limitation: Mobility: Walking and  moving around Mobility: Walking and Moving Around Current Status ([id number](G8978): 0 percent impaired, limited or restricted Mobility: Walking and Moving Around Goal Status ([id number](G8979): 0 percent impaired, limited or restricted Mobility: Walking and Moving Around Discharge Status [phone](G8980): 0 percent impaired, limited or restricted    Judson RochHildreth, Raylin Winer Gardner 04/27/2016, 11:58 AM 04/27/2016   Ranae PalmsElizabeth Riker Collier, PT

## 2016-04-27 NOTE — Progress Notes (Signed)
  Echocardiogram 2D Echocardiogram with Definity has been performed.  Leta JunglingCooper, Sephira Zellman M 04/27/2016, 10:46 AM

## 2016-04-27 NOTE — Discharge Instructions (Signed)
Transient Ischemic Attack °A transient ischemic attack (TIA) is a "warning stroke" that causes stroke-like symptoms. A TIA does not cause lasting damage to the brain. The symptoms of a TIA can happen fast and do not last long. It is important to know the symptoms of a TIA and what to do. This can help prevent stroke or death. °Follow these instructions at home: °· Take medicines only as told by your doctor. Make sure you understand all of the instructions. °· You may need to take aspirin or warfarin medicine. Warfarin needs to be taken exactly as told. °¨ Taking too much or too little warfarin is dangerous. Blood tests must be done as often as told by your doctor. A PT blood test measures how long it takes for blood to clot. Your PT is used to calculate another value called an INR. Your PT and INR help your doctor adjust your warfarin dosage. He or she will make sure you are taking the right amount. °¨ Food can cause problems with warfarin and affect the results of your blood tests. This is true for foods high in vitamin K. Eat the same amount of foods high in vitamin K each day. Foods high in vitamin K include spinach, kale, broccoli, cabbage, collard and turnip greens, Brussels sprouts, peas, cauliflower, seaweed, and parsley. Other foods high in vitamin K include beef and pork liver, green tea, and soybean oil. Eat the same amount of foods high in vitamin K each day. Avoid big changes in your diet. Tell your doctor before changing your diet. Talk to a food specialist (dietitian) if you have questions. °¨ Many medicines can cause problems with warfarin and affect your PT and INR. Tell your doctor about all medicines you take. This includes vitamins and dietary pills (supplements). Do not take or stop taking any prescribed or over-the-counter medicines unless your doctor tells you to. °¨ Warfarin can cause more bruising or bleeding. Hold pressure over any cuts for longer than normal. Talk to your doctor about other  side effects of warfarin. °¨ Avoid sports or activities that may cause injury or bleeding. °¨ Be careful when you shave, floss, or use sharp objects. °¨ Avoid or drink very little alcohol while taking warfarin. Tell your doctor if you change how much alcohol you drink. °¨ Tell your dentist and other doctors that you take warfarin before any procedures. °· Follow your diet program as told, if you are given one. °· Keep a healthy weight. °· Stay active. Try to get at least 30 minutes of activity on all or most days. °· Do not use any tobacco products, including cigarettes, chewing tobacco, or electronic cigarettes. If you need help quitting, ask your doctor. °· Limit alcohol intake to no more than 1 drink per day for nonpregnant women and 2 drinks per day for men. One drink equals 12 ounces of beer, 5 ounces of wine, or 1½ ounces of hard liquor. °· Do not abuse drugs. °· Keep your home safe so you do not fall. You can do this by: °¨ Putting grab bars in the bedroom and bathroom. °¨ Raising toilet seats. °¨ Putting a seat in the shower. °· Keep all follow-up visits as told by your doctor. This is important. °Contact a doctor if: °· Your personality changes. °· You have trouble swallowing. °· You have double vision. °· You are dizzy. °· You have a fever. °Get help right away if: °These symptoms may be an emergency. Do not wait to see if   the symptoms will go away. Get medical help right away. Call your local emergency services (911 in the [place] Do not drive yourself to the hospital. °· You have sudden weakness or lose feeling (go numb), especially on one side of the body. This can affect your: °¨ Face. °¨ Arm. °¨ Leg. °· You have sudden trouble walking. °· You have sudden trouble moving your arms or legs. °· You have sudden confusion. °· You have trouble talking. °· You have trouble understanding. °· You have sudden trouble seeing in one or both eyes. °· You lose your balance. °· Your movements are not smooth. °· You  have a sudden, very bad headache with no known cause. °· You have new chest pain. °· Your heartbeat is unsteady. °· You are partly or totally unaware of what is going on around you. °This information is not intended to replace advice given to you by your health care provider. Make sure you discuss any questions you have with your health care provider. °Document Released: 12/09/2007 Document Revised: 11/03/2015 Document Reviewed: 06/06/2013 °Elsevier Interactive Patient Education © 2017 [employer] ° °

## 2016-04-27 NOTE — Care Management Note (Signed)
Case Management Note  Patient Details  Name: Dale Reed MRN: [id number] Date of Birth: 04/28/1975  Subjective/Objective:                    Action/Plan: Pt discharging home with self care. No f/u per PT/OT and no DME needs. Pt has insurance and PCP. No further needs per CM.   Expected Discharge Date:  04/27/16               Expected Discharge Plan:  Home/Self Care  In-House Referral:     Discharge planning Services     Post Acute Care Choice:    Choice offered to:     DME Arranged:    DME Agency:     HH Arranged:    HH Agency:     Status of Service:  Completed, signed off  If discussed at [employer] Length of Stay Meetings, dates discussed:    Additional Comments:  [name], RN 04/27/2016, 2:24 PM

## 2016-04-27 NOTE — Progress Notes (Signed)
Safety precautions and orders reviewed with patient. All questions answered at this time. Transport home per family.   Sim BoastHavy, RN

## 2016-04-27 NOTE — Evaluation (Signed)
Occupational Therapy Evaluation Patient Details Name: Dale Reed MRN: [id number] DOB: 03/03/1976 Today's Date: 04/27/2016    History of Present Illness Pt is a 41 y.o. male who presented to the ED with slurred speech, weakness, and malaise. CT and MRI negative for acute abnormalities. PMH significant for back pain and diabetes mellitus type 2.   Clinical Impression   PTA, pt was independent with ADL and functional mobility. 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 Pt currently presents with good praxis, strength, and coordination for all basic ADL tasks. Pt does require supervision and verbal cues for IADL, home management, and work related tasks due to slight cognitive deficits. Administered Montreal Cognitive Assessment and pt scored slightly below normal (25/30; normal score is considered greater than or equal to 26/30). Feel pt would benefit from continued education concerning compensatory strategies to improve safety and independence with IADL and work related tasks and OT will continue to follow during acute stay. However, at this time no OT follow-up is recommended. Initiated education concerning cognitive compensatory strategies and will continue to reinforce.    Follow Up Recommendations  No OT follow up;Supervision - Intermittent    Equipment Recommendations  None recommended by OT    Recommendations for Other Services       Precautions / Restrictions Restrictions Weight Bearing Restrictions: No      Mobility Bed Mobility Overal bed mobility: Independent                Transfers Overall transfer level: Independent Equipment used: None                  Balance Overall balance assessment: No apparent balance deficits (not formally assessed)                                          ADL Overall ADL's : Independent;At baseline                                        General ADL Comments: Pt required supervision and verbal cues for simple financial management IADL tasks. Able to complete all basic ADL in hospital room independently. No noted cognitive or praxis difficulties during session.     Vision Vision Assessment?: Yes Eye Alignment: Within Functional Limits Ocular Range of Motion: Within Functional Limits Alignment/Gaze Preference: Within Defined Limits Tracking/Visual Pursuits: Able to track stimulus in all quads without difficulty Saccades: Within functional limits Additional Comments: Able to functionally use central and peripheral vision during ADL.   Perception     Praxis Praxis Praxis tested?: Within functional limits Praxis-Other Comments: Reports ideomotor and ideational praxis problems at the onset of symptoms but feels this has resolved. No difficulties with praxis noted.    Pertinent Vitals/Pain Pain Assessment: No/denies pain     Hand Dominance Right   Extremity/Trunk Assessment Upper Extremity Assessment Upper Extremity Assessment: Overall WFL for tasks assessed   Lower Extremity Assessment Lower Extremity Assessment: Overall WFL for tasks assessed       Communication Communication Communication: No difficulties   Cognition Arousal/Alertness: Awake/alert Behavior During Therapy: WFL for tasks assessed/performed Overall Cognitive Status: No family/caregiver present to determine baseline cognitive functioning Area of Impairment: Memory Orientation Level:  (See comments  section)   Memory: Decreased short-term memory         General Comments: Slight cognitive limitations impacting safety with IADL and work related tasks. Administered MOCA original version with pt scoring 25/30 (26/30 or greater considered normal score). Pt with most difficutly with delayed recall, sentence repetition, and verbal fluency. Also pt required increased time and used verbal problem solving skills to determine  the month.   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        OT Problem List: Decreased cognition;Decreased safety awareness   OT Treatment/Interventions: Self-care/ADL training;Patient/family education;Cognitive remediation/compensation;Therapeutic activities    OT Goals(Current goals can be found in the care plan section) Acute Rehab OT Goals Patient Stated Goal: to go home today OT Goal Formulation: With patient Time For Goal Achievement: 05/04/16 Potential to Achieve Goals: Good ADL Goals Additional ADL Goal #1: Pt will incorporate 3 strategies to compensate for decreased short-term memory into daily routine in order to improve safety with work related tasks. Additional ADL Goal #2: Pt will complete simple financial management tasks with no more than 1 verbal cue in order to improve independence with IADL tasks.  OT Frequency: Min 1X/week   Barriers to D/C:            Co-evaluation              End of Session Nurse Communication: Mobility status  Activity Tolerance: Patient tolerated treatment well Patient left: in chair   Time: 0827-0900 OT Time Calculation (min): 33 min Charges:  OT General Charges $OT Visit: 1 Procedure OT Evaluation $OT Eval Moderate Complexity: 1 Procedure OT Treatments $Therapeutic Activity: 8-22 mins G-Codes: OT G-codes **NOT FOR INPATIENT CLASS** Functional Assessment Tool Used: clinical judgement Functional Limitation:  Self care Self Care Current Status (Z6109): At least 1 percent but less than 20 percent impaired, limited or restricted Self Care Goal Status (U0454): At least 1 percent but less than 20 percent impaired, limited or restricted  Doristine Section, OTR/L [phone] 04/27/2016, 9:50 AM

## 2017-02-23 ENCOUNTER — Encounter (HOSPITAL_BASED_OUTPATIENT_CLINIC_OR_DEPARTMENT_OTHER): Payer: Self-pay

## 2017-02-23 ENCOUNTER — Emergency Department (HOSPITAL_BASED_OUTPATIENT_CLINIC_OR_DEPARTMENT_OTHER)
Admission: EM | Admit: 2017-02-23 | Discharge: 2017-02-23 | Disposition: A | Discharge status: Home / Self Care | Payer: BLUE CROSS/BLUE SHIELD | Attending: Emergency Medicine | Admitting: Emergency Medicine

## 2017-02-23 ENCOUNTER — Emergency Department (HOSPITAL_BASED_OUTPATIENT_CLINIC_OR_DEPARTMENT_OTHER): Payer: BLUE CROSS/BLUE SHIELD

## 2017-02-23 ENCOUNTER — Other Ambulatory Visit: Payer: Self-pay

## 2017-02-23 DIAGNOSIS — Z8673 Personal history of transient ischemic attack (TIA), and cerebral infarction without residual deficits: Secondary | ICD-10-CM | POA: Diagnosis not present

## 2017-02-23 DIAGNOSIS — E119 Type 2 diabetes mellitus without complications: Secondary | ICD-10-CM | POA: Insufficient documentation

## 2017-02-23 DIAGNOSIS — Z7984 Long term (current) use of oral hypoglycemic drugs: Secondary | ICD-10-CM | POA: Diagnosis not present

## 2017-02-23 DIAGNOSIS — R0789 Other chest pain: Secondary | ICD-10-CM | POA: Diagnosis not present

## 2017-02-23 DIAGNOSIS — W19XXXA Unspecified fall, initial encounter: Secondary | ICD-10-CM

## 2017-02-23 DIAGNOSIS — R079 Chest pain, unspecified: Secondary | ICD-10-CM | POA: Diagnosis present

## 2017-02-23 LAB — CBC
HEMATOCRIT: 45.9 % (ref 39.0–52.0)
HEMOGLOBIN: 16.8 g/dL (ref 13.0–17.0)
MCH: 29.5 pg (ref 26.0–34.0)
MCHC: 36.6 g/dL — AB (ref 30.0–36.0)
MCV: 80.5 fL (ref 78.0–100.0)
Platelets: 271 10*3/uL (ref 150–400)
RBC: 5.7 MIL/uL (ref 4.22–5.81)
RDW: 13.3 % (ref 11.5–15.5)
WBC: 7.9 10*3/uL (ref 4.0–10.5)

## 2017-02-23 LAB — BASIC METABOLIC PANEL
Anion gap: 8 (ref 5–15)
BUN: 16 mg/dL (ref 6–20)
CHLORIDE: 103 mmol/L (ref 101–111)
CO2: 25 mmol/L (ref 22–32)
CREATININE: 0.77 mg/dL (ref 0.61–1.24)
Calcium: 9.3 mg/dL (ref 8.9–10.3)
Glucose, Bld: 225 mg/dL — ABNORMAL HIGH (ref 65–99)
Potassium: 4 mmol/L (ref 3.5–5.1)
Sodium: 136 mmol/L (ref 135–145)

## 2017-02-23 LAB — TROPONIN I

## 2017-02-23 MED ORDER — ORPHENADRINE CITRATE ER 100 MG PO TB12: 100.0000 mg | ORAL_TABLET | Freq: Two times a day (BID) | ORAL | 0 refills | Status: DC

## 2017-02-23 MED ORDER — NAPROXEN 500 MG PO TABS: 500.0000 mg | ORAL_TABLET | Freq: Two times a day (BID) | ORAL | 0 refills | Status: DC

## 2017-02-23 MED ORDER — IBUPROFEN 800 MG PO TABS
800.0000 mg | ORAL_TABLET | Freq: Once | ORAL | Status: AC
  Administered 2017-02-23: 800 mg via ORAL
  Filled 2017-02-23: qty 1

## 2017-02-23 NOTE — ED Triage Notes (Signed)
C/o CP started last night-NAD-steady gait 

## 2017-02-23 NOTE — ED Provider Notes (Signed)
[facility] EMERGENCY DEPARTMENT Provider Note   CSN: [id number] Arrival date & time: 02/23/17  1927     History   Chief Complaint Chief Complaint  Patient presents with  . Chest Pain    HPI Dale Reed is a 41 y.o. male.  HPI Patient slipped yesterday on the ice and fell directly onto his back.  Reports it was painful but he was to get back up and go about his usual activities.  Reports today however it was much more painful.  He reports today he felt pain through the front and center of his chest.  He reports pain is much worse with certain twisting motions.  No associated shortness of breath, nausea, diaphoresis.  Patient did not try any ibuprofen or Tylenol for pain. Past Medical History:  Diagnosis Date  . Back pain   . Diabetes mellitus     Patient Active Problem List   Diagnosis Date Noted  . TIA (transient ischemic attack) 04/26/2016  . Type 2 diabetes mellitus without complication, without long-term current use of insulin [facility](HCC)     Past Surgical History:  Procedure Laterality Date  . APPENDECTOMY         Home Medications    Prior to Admission medications   Medication Sig Start Date End Date Taking? Authorizing Provider  glipiZIDE (GLUCOTROL) 10 MG tablet Take 10 mg by mouth 2 (two) times daily before a meal.    [provider]  metFORMIN (GLUCOPHAGE) 1000 MG tablet Take 1,000 mg by mouth 2 (two) times daily with a meal.    [provider]  naproxen (NAPROSYN) 500 MG tablet Take 1 tablet (500 mg total) by mouth 2 (two) times daily. 02/23/17   Arby BarrettePfeiffer, Tiegan Jambor, MD  orphenadrine (NORFLEX) 100 MG tablet Take 1 tablet (100 mg total) by mouth 2 (two) times daily. 02/23/17   Arby BarrettePfeiffer, Joell Usman, MD  oxyCODONE-acetaminophen (PERCOCET) 10-325 MG tablet Take 1 tablet by mouth at bedtime.    [provider]    Family History Family History  Problem Relation Age of Onset  . Diabetes Other   . Cancer Other   . Hypertension Other       Social History Social History   Tobacco Use  . Smoking status: Never Smoker  . Smokeless tobacco: Never Used  Substance Use Topics  . Alcohol use: No  . Drug use: No     Allergies   Patient has no known allergies.   Review of Systems Review of Systems 10 Systems reviewed and are negative for acute change except as noted in the HPI.   Physical Exam Updated Vital Signs BP 115/69   Pulse 80   Temp 98.2 F (36.8 C) (Oral)   Resp 18   Ht 5\' 9"  (1.753 m)   Wt 113.4 kg (250 lb 0 oz)   SpO2 100%   BMI 36.92 kg/m   Physical Exam  Constitutional: He is oriented to person, place, and time. He appears well-developed and well-nourished.  HENT:  Head: Normocephalic and atraumatic.  Eyes: Conjunctivae are normal.  Neck: Neck supple.  No C-spine tenderness to palpation.  Cardiovascular: Normal rate, regular rhythm, normal heart sounds and intact distal pulses.  No murmur heard. Pulmonary/Chest: Effort normal and breath sounds normal. No respiratory distress.  Anterior chest wall tender to compression at the left sternal costal margins.  No crepitus.  Abdominal: Soft. He exhibits no distension. There is no tenderness.  Musculoskeletal: He exhibits no edema.  Tender to palpation left posterior  thoracic chest wall around ribs 10 through 12.  Pain significantly reproduced by twisting at the waist and sitting forward.  Neurological: He is alert and oriented to person, place, and time. No cranial nerve deficit. He exhibits normal muscle tone. Coordination normal.  Skin: Skin is warm and dry.  Psychiatric: He has a normal mood and affect.  Nursing note and vitals reviewed.    ED Treatments / Results  Labs (all labs ordered are listed, but only abnormal results are displayed) Labs Reviewed  BASIC METABOLIC PANEL - Abnormal; Notable for the following components:      Result Value   Glucose, Bld 225 (*)    All other components within normal limits  CBC - Abnormal; Notable  for the following components:   MCHC 36.6 (*)    All other components within normal limits  TROPONIN I    EKG  EKG Interpretation None       Radiology Dg Chest 2 View  Result Date: 02/23/2017 CLINICAL DATA:  Midchest pain radiating to the left side, sudden onset today. Fell yesterday. EXAM: CHEST  2 VIEW COMPARISON:  06/02/2015 FINDINGS: The lungs are clear. The pulmonary vasculature is normal. Heart size is normal. Hilar and mediastinal contours are unremarkable. There is no pleural effusion. IMPRESSION: No active cardiopulmonary disease. Electronically Signed   By: Ellery Plunkaniel R Mitchell M.D.   On: 02/23/2017 20:26    Procedures Procedures (including critical care time)  Medications Ordered in ED Medications  ibuprofen (ADVIL,MOTRIN) tablet 800 mg (800 mg Oral Given 02/23/17 2124)     Initial Impression / Assessment and Plan / ED Course  I have reviewed the triage vital signs and the nursing notes.  Pertinent labs & imaging results that were available during my care of the patient were reviewed by me and considered in my medical decision making (see chart for details).     Final Clinical Impressions(s) / ED Diagnoses   Final diagnoses:  Chest wall pain  Fall, initial encounter  Patient had fall yesterday landing directly on his thoracic back.  Today he also noted chest pain.  Pain is brought on by position.  Associated symptoms of ischemic chest pain.  Diagnostic workup is within normal limits.  We have reviewed the patient's poorly controlled diabetes.  He is due to see his PCP next week to address this.  Patient will use naproxen and Norflex for pain control.  ED Discharge Orders        Ordered    naproxen (NAPROSYN) 500 MG tablet  2 times daily     02/23/17 2132    orphenadrine (NORFLEX) 100 MG tablet  2 times daily     02/23/17 2132       Arby BarrettePfeiffer, Lakiah Dhingra, MD 02/23/17 2138

## 2017-02-23 NOTE — ED Notes (Signed)
Patient transported to X-ray 

## 2017-02-23 NOTE — ED Notes (Signed)
Patient transported to CT 

## 2017-03-14 IMAGING — CT CT ABD-PELV W/ CM
2 of 5 series · 9 of 46 positions shown, 10 images · IV contrast (Iodine)
Comparison: None.

CLINICAL DATA: Right-sided abdominal pain for 1 day

EXAM:
CT ABDOMEN AND PELVIS WITH CONTRAST
TECHNIQUE: Multidetector CT imaging of the abdomen and pelvis was performed
using the standard protocol following bolus administration of
intravenous contrast.
CONTRAST:  100mL PKZ2PU-200 IOPAMIDOL (PKZ2PU-200) INJECTION 61%

[Series 201: routine, idose (2) · axial · 0.78mm/px · z∈[+56,+476]mm · 6 of 110 slices shown, 7 images]
[im 13/110  soft-tissue]
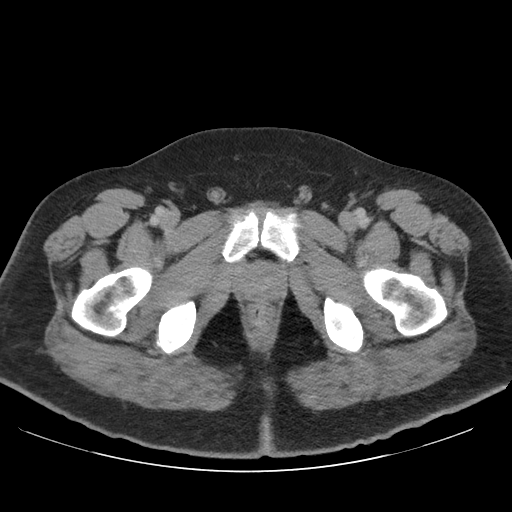
[im 13/110  bone]
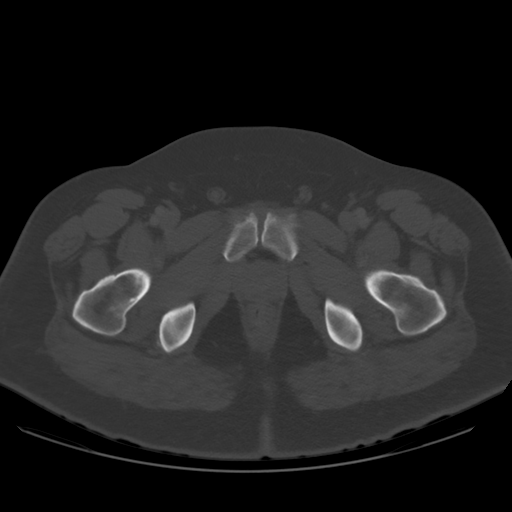
[im 31/110  soft-tissue]
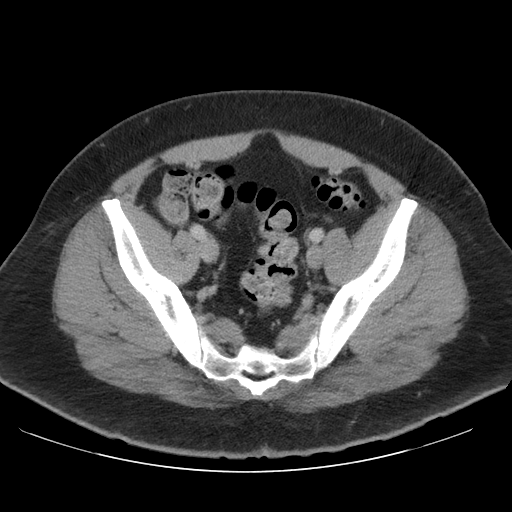
[im 49/110  soft-tissue]
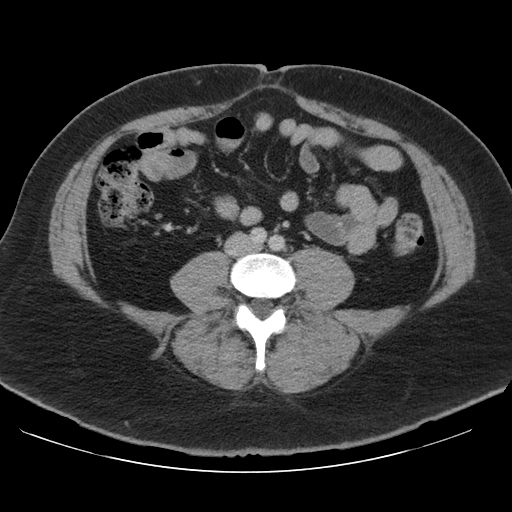
[im 61/110  soft-tissue]
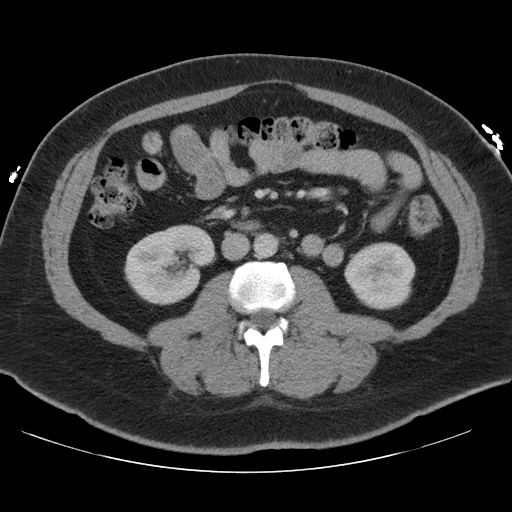
[im 79/110  soft-tissue]
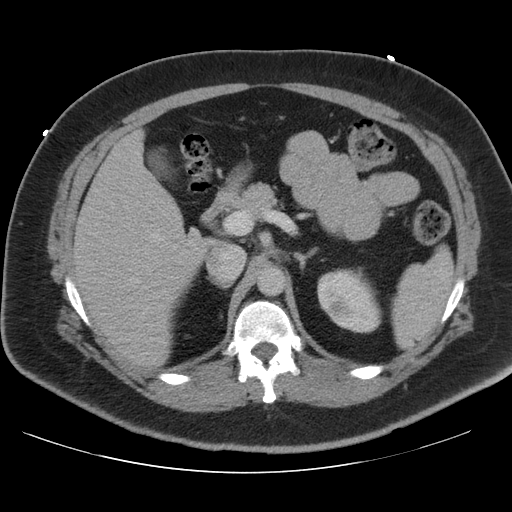
[im 97/110  soft-tissue]
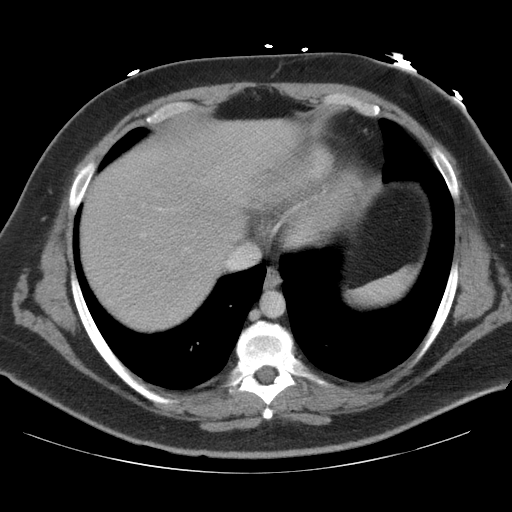

[Series 203: coronals, idose (2) · coronal · 0.45mm/px · 3 of 139 slices shown]
[im 47/139  soft-tissue]
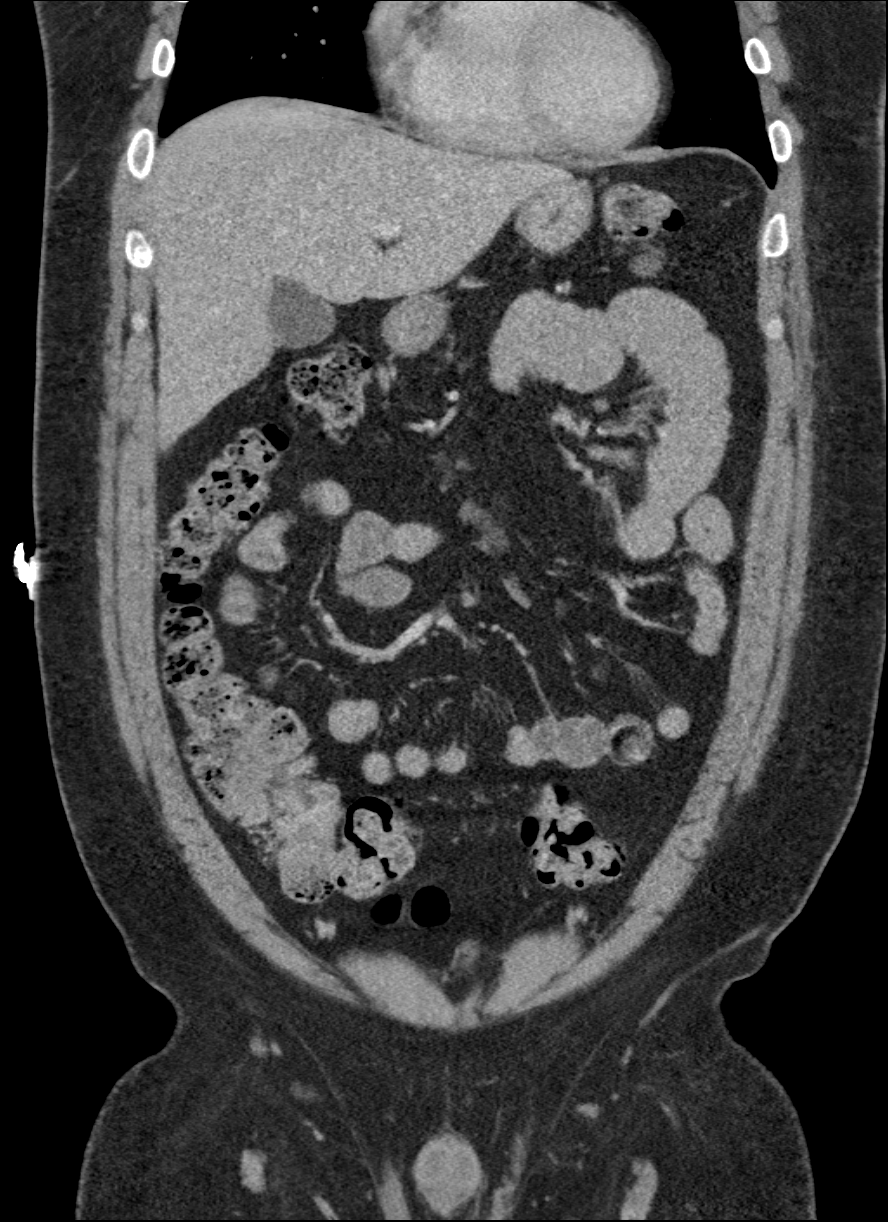
[im 62/139  soft-tissue]
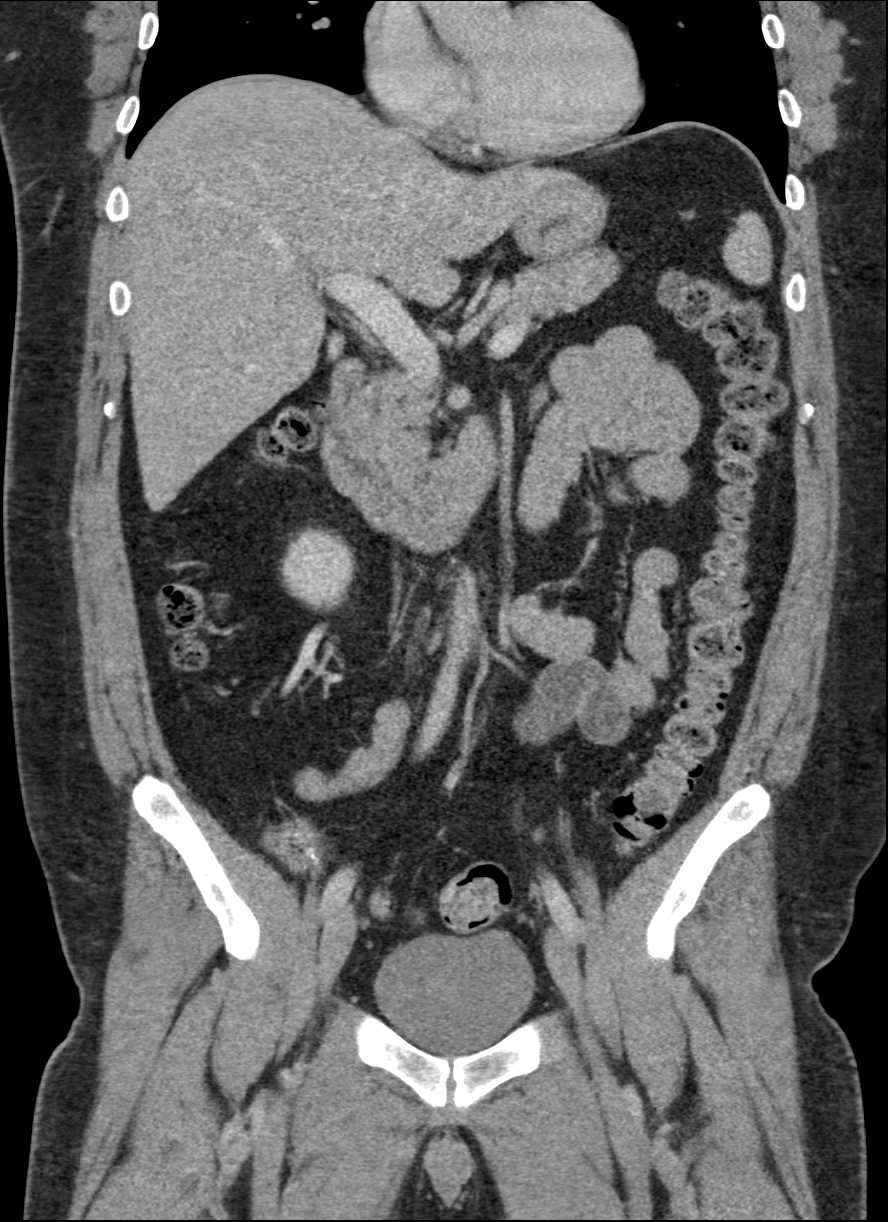
[im 77/139  soft-tissue]
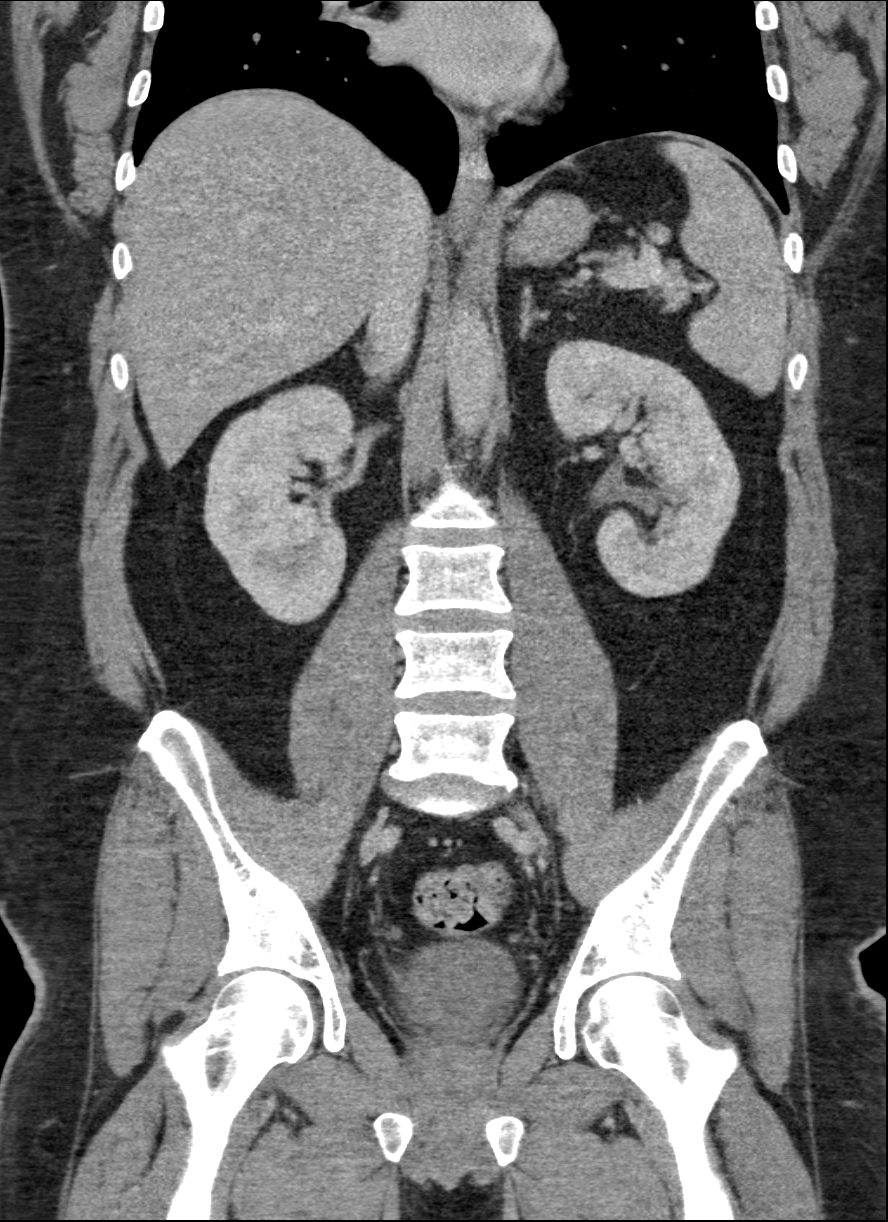

[9 of 46 positions shown; findings below may reference images not displayed]

FINDINGS: Lower chest:  Lung bases are clear.

Hepatobiliary: No focal liver lesions are identified. Gallbladder
wall is not appreciably thickened. There is no biliary duct
dilatation.

Pancreas: There is no pancreatic mass or inflammatory focus.

Spleen: No splenic lesions are evident.

Adrenals/Urinary Tract: Adrenals appear normal bilaterally. There is
a cyst arising from the medial upper pole left kidney measuring
x 2.0 cm. There is a cyst in the lower pole the right kidney
measuring 1.2 x 1.0 cm. There is no appreciable hydronephrosis on
either side. There is no renal or ureteral calculus on either side.
The urinary bladder is midline with wall thickness within normal
limits.

Stomach/Bowel: There is moderate stool throughout the colon
diffusely. There is no bowel wall or mesenteric thickening. No bowel
obstruction. No free air or portal venous air.

Vascular/Lymphatic: There is no demonstrable abdominal aortic
aneurysm. No vascular lesions are evident on this study. There is no
appreciable adenopathy in the abdomen or pelvis.

Reproductive: Prostate and seminal vesicles appear normal in size
and configuration. There is no pelvic mass or pelvic fluid
collection.

Other: Appendix is absent. There is no ascites or abscess in the
abdomen or pelvis. There is a small ventral hernia containing only
fat.

Musculoskeletal: There are no blastic or lytic bone lesions. There
is no intramuscular or abdominal wall lesion.
IMPRESSION: Fairly diffuse stool throughout the colon. No bowel obstruction. No
abscess. Appendix absent.

No renal or ureteral calculus.  No hydronephrosis.

Small ventral hernia containing only fat.

## 2017-06-07 ENCOUNTER — Other Ambulatory Visit (HOSPITAL_BASED_OUTPATIENT_CLINIC_OR_DEPARTMENT_OTHER): Payer: Self-pay | Admitting: Internal Medicine

## 2017-06-07 ENCOUNTER — Ambulatory Visit (HOSPITAL_BASED_OUTPATIENT_CLINIC_OR_DEPARTMENT_OTHER)
Admission: RE | Admit: 2017-06-07 | Discharge: 2017-06-07 | Disposition: A | Discharge status: Home / Self Care | Payer: BLUE CROSS/BLUE SHIELD | Source: Ambulatory Visit | Attending: Internal Medicine | Admitting: Internal Medicine

## 2017-06-07 DIAGNOSIS — J40 Bronchitis, not specified as acute or chronic: Secondary | ICD-10-CM

## 2017-06-07 DIAGNOSIS — J209 Acute bronchitis, unspecified: Secondary | ICD-10-CM | POA: Diagnosis not present

## 2017-06-07 DIAGNOSIS — R0602 Shortness of breath: Secondary | ICD-10-CM

## 2017-06-08 ENCOUNTER — Encounter (HOSPITAL_BASED_OUTPATIENT_CLINIC_OR_DEPARTMENT_OTHER): Payer: Self-pay

## 2017-06-08 ENCOUNTER — Other Ambulatory Visit: Payer: Self-pay

## 2017-06-08 ENCOUNTER — Emergency Department (HOSPITAL_BASED_OUTPATIENT_CLINIC_OR_DEPARTMENT_OTHER)
Admission: EM | Admit: 2017-06-08 | Discharge: 2017-06-08 | Disposition: A | Discharge status: Home / Self Care | Payer: BLUE CROSS/BLUE SHIELD | Attending: Emergency Medicine | Admitting: Emergency Medicine

## 2017-06-08 DIAGNOSIS — Z8673 Personal history of transient ischemic attack (TIA), and cerebral infarction without residual deficits: Secondary | ICD-10-CM | POA: Diagnosis not present

## 2017-06-08 DIAGNOSIS — Z7984 Long term (current) use of oral hypoglycemic drugs: Secondary | ICD-10-CM | POA: Insufficient documentation

## 2017-06-08 DIAGNOSIS — E119 Type 2 diabetes mellitus without complications: Secondary | ICD-10-CM | POA: Insufficient documentation

## 2017-06-08 DIAGNOSIS — J069 Acute upper respiratory infection, unspecified: Secondary | ICD-10-CM | POA: Diagnosis not present

## 2017-06-08 MED ORDER — GUAIFENESIN-CODEINE 100-10 MG/5ML PO SYRP
5.0000 mL | ORAL_SOLUTION | Freq: Three times a day (TID) | ORAL | 0 refills | Status: DC | PRN
Start: 2017-06-08 — End: 2017-11-14

## 2017-06-08 NOTE — ED Triage Notes (Signed)
Pt reports URI symptoms, cough, body aches x 2 weeks. Pt had an outpatient CXR yesterday, but has not received results.

## 2017-06-08 NOTE — ED Provider Notes (Signed)
[facility] EMERGENCY DEPARTMENT Provider Note   CSN: [id number] Arrival date & time: 06/08/17  1617     History   Chief Complaint Chief Complaint  Patient presents with  . URI    HPI Dale Reed is a 42 y.o. male.  HPI  Dale Reed is a 42 year old male with a history of type 2 diabetes who presents to the emergency department for evaluation of cough and congestion.  Patient reports that his cough began five days ago.  Was seen at his primary care office two days ago and prescribed amoxicillin, Tessalon Perles and given an albuterol inhaler.  States that despite taking this medication his cough has not improved.  The cough is nonproductive, he occasionally has shortness of breath with coughing attacks but denies trouble breathing outside of this.  He reports mild anterior chest wall pain with cough only.  States that he also has had runny nose with congestion which makes it difficult for him to breathe at night time.  He denies fevers, chills, sore throat, ear pain, headache, wheezing, abdominal pain, nausea/vomiting, leg swelling.  Denies close contacts with similar illness.  He reports that he had a chest x-ray performed yesterday, but is unsure of the results.  Past Medical History:  Diagnosis Date  . Back pain   . Diabetes mellitus     Patient Active Problem List   Diagnosis Date Noted  . TIA (transient ischemic attack) 04/26/2016  . Type 2 diabetes mellitus without complication, without long-term current use of insulin [facility])     Past Surgical History:  Procedure Laterality Date  . APPENDECTOMY          Home Medications    Prior to Admission medications   Medication Sig Start Date End Date Taking? Authorizing Provider  glipiZIDE (GLUCOTROL) 10 MG tablet Take 10 mg by mouth 2 (two) times daily before a meal.    [provider]  metFORMIN (GLUCOPHAGE) 1000 MG tablet Take 1,000 mg by mouth 2 (two) times daily with a meal.    [provider]   naproxen (NAPROSYN) 500 MG tablet Take 1 tablet (500 mg total) by mouth 2 (two) times daily. 02/23/17   Arby Barrette, MD  orphenadrine (NORFLEX) 100 MG tablet Take 1 tablet (100 mg total) by mouth 2 (two) times daily. 02/23/17   Arby Barrette, MD  oxyCODONE-acetaminophen (PERCOCET) 10-325 MG tablet Take 1 tablet by mouth at bedtime.    [provider]    Family History Family History  Problem Relation Age of Onset  . Diabetes Other   . Cancer Other   . Hypertension Other     Social History Social History   Tobacco Use  . Smoking status: Never Smoker  . Smokeless tobacco: Never Used  Substance Use Topics  . Alcohol use: No  . Drug use: No     Allergies   Patient has no known allergies.   Review of Systems Review of Systems  Constitutional: Negative for chills and fever.  HENT: Positive for congestion, postnasal drip and rhinorrhea. Negative for ear pain, sore throat and trouble swallowing.   Respiratory: Positive for cough and shortness of breath (with coughing). Negative for wheezing.   Cardiovascular: Positive for chest pain (with cough only).  Gastrointestinal: Negative for abdominal pain, nausea and vomiting.  Skin: Negative for rash.  Neurological: Negative for dizziness, light-headedness and headaches.     Physical Exam Updated Vital Signs BP 121/79 (BP Location: Left Arm)   Pulse 81  Temp 98.1 F (36.7 C) (Oral)   Resp 20   Ht 5\' 9"  (1.753 m)   Wt 111.1 kg (245 lb)   SpO2 98%   BMI 36.18 kg/m   Physical Exam  Constitutional: He is oriented to person, place, and time. He appears well-developed and well-nourished. No distress.  Sitting at bedside in no apparent distress.  Nontoxic-appearing.  HENT:  Head: Normocephalic and atraumatic.  Right Ear: External ear normal.  Left Ear: External ear normal.  Mucous members moist.  Posterior oropharynx appears mildly erythematous.  No tonsillar edema or exudate.  Uvula midline.  Airway patent  and able to handle oral secretions.  Bilateral TMs with good cone of light.  No tenderness over maxillary or frontal sinuses.  Eyes: Right eye exhibits no discharge. Left eye exhibits no discharge.  Neck: Normal range of motion. Neck supple.  Cardiovascular: Normal rate, regular rhythm and intact distal pulses. Exam reveals no friction rub.  No murmur heard. Pulmonary/Chest: Effort normal and breath sounds normal. No stridor. No respiratory distress. He has no wheezes. He has no rales.  Abdominal: Soft. There is no tenderness.  Lymphadenopathy:    He has cervical adenopathy.  Neurological: He is alert and oriented to person, place, and time. Coordination normal.  Skin: Skin is warm and dry. He is not diaphoretic.  Psychiatric: He has a normal mood and affect. His behavior is normal.  Nursing note and vitals reviewed.    ED Treatments / Results  Labs (all labs ordered are listed, but only abnormal results are displayed) Labs Reviewed - No data to display  EKG None  Radiology Dg Chest 2 View  Result Date: 06/07/2017 CLINICAL DATA:  Cough and chest congestion for 1 month. EXAM: CHEST - 2 VIEW COMPARISON:  02/23/2017 FINDINGS: The heart size and mediastinal contours are within normal limits. Both lungs are clear. The visualized skeletal structures are unremarkable. IMPRESSION: No active cardiopulmonary disease. Electronically Signed   By: Myles Rosenthal M.D.   On: 06/07/2017 16:39    Procedures Procedures (including critical care time)  Medications Ordered in ED Medications - No data to display   Initial Impression / Assessment and Plan / ED Course  I have reviewed the triage vital signs and the nursing notes.  Pertinent labs & imaging results that were available during my care of the patient were reviewed by me and considered in my medical decision making (see chart for details).     Patients symptoms are consistent with URI, likely viral etiology.  Outpatient chest x-ray  yesterday reviewed, no acute abnormality.  Discussed that antibiotics do not treat viral infections.  Patient has already started amoxicillin by his primary care doctor 2 days ago, counseled him that he can finish these antibiotics given he has already started.  Pt will be discharged with symptomatic treatment.  Will prescribe cough suppressant with codeine given he has been having trouble sleeping due to cough.  Discussed that this medicine can make him drowsy and he should not drive or work while taking it.  Patient verbalizes understanding and is agreeable with plan.  Discussed return precautions and patient agrees.  Pt is hemodynamically stable & in NAD prior to dc.   Final Clinical Impressions(s) / ED Diagnoses   Final diagnoses:  Viral upper respiratory tract infection    ED Discharge Orders        Ordered    guaiFENesin-codeine (ROBITUSSIN AC) 100-10 MG/5ML syrup  3 times daily PRN     06/08/17 1858  Kellie ShropshireShrosbree, Aycen Porreca J, PA-C 06/08/17 Rondel [id number]    Schlossman, Erin, MD 06/10/17 1352

## 2017-06-08 NOTE — Discharge Instructions (Signed)
Your chest x-ray yesterday was normal.  No pneumonia.  I have written you a prescription for cough medicine with codeine in it.  This medicine can make you drowsy so please do not drive or work while taking it.  To the emergency department if you have any new or concerning symptoms including trouble breathing, fever with productive cough, vomiting that does not stop.

## 2017-08-09 ENCOUNTER — Emergency Department (HOSPITAL_COMMUNITY)
Admission: EM | Admit: 2017-08-09 | Discharge: 2017-08-09 | Disposition: A | Discharge status: Home / Self Care | Payer: BLUE CROSS/BLUE SHIELD | Attending: Emergency Medicine | Admitting: Emergency Medicine

## 2017-08-09 ENCOUNTER — Other Ambulatory Visit: Payer: Self-pay

## 2017-08-09 ENCOUNTER — Encounter (HOSPITAL_COMMUNITY): Payer: Self-pay | Admitting: Emergency Medicine

## 2017-08-09 DIAGNOSIS — Y990 Civilian activity done for income or pay: Secondary | ICD-10-CM | POA: Insufficient documentation

## 2017-08-09 DIAGNOSIS — Y999 Unspecified external cause status: Secondary | ICD-10-CM | POA: Insufficient documentation

## 2017-08-09 DIAGNOSIS — S39012A Strain of muscle, fascia and tendon of lower back, initial encounter: Secondary | ICD-10-CM | POA: Insufficient documentation

## 2017-08-09 DIAGNOSIS — M5489 Other dorsalgia: Secondary | ICD-10-CM | POA: Diagnosis present

## 2017-08-09 DIAGNOSIS — X500XXA Overexertion from strenuous movement or load, initial encounter: Secondary | ICD-10-CM | POA: Diagnosis not present

## 2017-08-09 DIAGNOSIS — Z79899 Other long term (current) drug therapy: Secondary | ICD-10-CM | POA: Insufficient documentation

## 2017-08-09 DIAGNOSIS — E119 Type 2 diabetes mellitus without complications: Secondary | ICD-10-CM | POA: Insufficient documentation

## 2017-08-09 DIAGNOSIS — Y9289 Other specified places as the place of occurrence of the external cause: Secondary | ICD-10-CM | POA: Insufficient documentation

## 2017-08-09 DIAGNOSIS — Y9389 Activity, other specified: Secondary | ICD-10-CM | POA: Diagnosis not present

## 2017-08-09 DIAGNOSIS — Z7984 Long term (current) use of oral hypoglycemic drugs: Secondary | ICD-10-CM | POA: Insufficient documentation

## 2017-08-09 MED ORDER — METHOCARBAMOL 500 MG PO TABS: ORAL_TABLET | ORAL | 0 refills | Status: DC

## 2017-08-09 MED ORDER — METHOCARBAMOL 500 MG PO TABS: 500.0000 mg | ORAL_TABLET | Freq: Two times a day (BID) | ORAL | 0 refills | Status: DC

## 2017-08-09 MED ORDER — IBUPROFEN 800 MG PO TABS: 800.0000 mg | ORAL_TABLET | Freq: Three times a day (TID) | ORAL | 0 refills | Status: AC

## 2017-08-09 MED ORDER — CYCLOBENZAPRINE HCL 10 MG PO TABS: 10.0000 mg | ORAL_TABLET | Freq: Two times a day (BID) | ORAL | 0 refills | Status: DC | PRN

## 2017-08-09 MED ORDER — LIDOCAINE 5 % EX PTCH: 1.0000 | MEDICATED_PATCH | CUTANEOUS | 0 refills | Status: DC

## 2017-08-09 MED ORDER — DEXAMETHASONE SODIUM PHOSPHATE 10 MG/ML IJ SOLN
10.0000 mg | Freq: Once | INTRAMUSCULAR | Status: AC
  Administered 2017-08-09: 10 mg via INTRAMUSCULAR
  Filled 2017-08-09: qty 1

## 2017-08-09 NOTE — ED Triage Notes (Signed)
Pt reports right sided back pain that started yesterday and is going down into right leg. Pt reports pain is the same sitting or walking. Denies any numbness.

## 2017-08-09 NOTE — Discharge Instructions (Addendum)
Thank you for allowing me to provide your care today in the emergency department.  You can take 800 mg of ibuprofen (1 tablet of the prescribed medication) with food or at thousand milligrams of Tylenol every 8 hours for pain control.  Please note there is 325 mg of Tylenol in each tablet of your home Percocet.  Make sure not to take more than 4000 mg of Tylenol in a 24-hour period.   Flexeril and Robaxin are both muscle relaxers.  These medications should not be taken at the same time.  Flexeril may be taken up to 2 times daily.  This medication can make you drowsy so you should not take it if you have to drive or work.  It may help you get more rest at night.  For Robaxin, it should not make you drowsy and can be taken when you need to work.  For the first 2 to 3 days, take 3 tablets up to 4 times daily when needed.  After 2 to 3 days, decrease to taking 2 tablets up to 4 times daily when needed.  Apply 1 lidocaine patch onto the skin daily.  The patch can be replaced every 12 hours.  I have attached stretches of the low back.  Stretching the muscles of the low back and hip should also help to improve your pain.  You can also apply ice for 15 to 20 minutes up to 3-4 times a day.  Please note, the Decadron injection that you received in the emergency department as a corticosteroid.  Your home blood sugars may increase somewhat over the next few days due to this medication.  Please follow-up with your primary care provider if you are not having any relief with this regimen in the next 5 to 7 days.  Return to the emergency department if you develop high fever, weakness or numbness in the legs, if you are unable to control your bowel or bladder, have blood in your urine, or develop other new concerning symptoms.

## 2017-08-09 NOTE — ED Provider Notes (Signed)
[facility] EMERGENCY DEPARTMENT Provider Note   CSN: [id number] Arrival date & time: 08/09/17  0730     History   Chief Complaint Chief Complaint  Patient presents with  . Back Pain    HPI Hunner Garcon is a 42 y.o. male with a history of diabetes mellitus type II, TIA, and low back pain who presents to the emergency department with a chief complaint of back pain.  The patient endorses gradual onset, gradually worsening right-sided low back pain that radiates down his right leg to his foot.  He feels like the pain began sometime within the last week, but became severe yesterday.  He characterizes the pain as sharp.  He denies urinary or fecal incontinence, weakness, numbness, fever, chills, urinary symptoms, abdominal pain, or nausea, vomiting, diarrhea.  He can walk, but it is painful.  Pain is worse with sitting for long periods of time.  No known alleviating factors.  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  He blood sugar has been good at home.  He has been treating his symptoms with his home Percocet and took 200 mg of ibuprofen several times over the last day with no improvement.  The history is provided by the patient. No language interpreter was used.  Back Pain   Pertinent negatives include no chest pain, no fever, no numbness, no headaches, no abdominal pain, no dysuria and no weakness.    Past Medical History:  Diagnosis Date  . Back pain   . Diabetes mellitus     Patient Active Problem List   Diagnosis Date Noted  . TIA (transient ischemic attack) 04/26/2016  . Type 2 diabetes mellitus without complication, without long-term current use of insulin [facility])     Past Surgical History:  Procedure Laterality Date  . APPENDECTOMY          Home Medications    Prior to Admission  medications   Medication Sig Start Date End Date Taking? Authorizing Provider  glipiZIDE (GLUCOTROL) 10 MG tablet Take 10 mg by mouth 2 (two) times daily before a meal.    [provider]  guaiFENesin-codeine (ROBITUSSIN AC) 100-10 MG/5ML syrup Take 5 mLs by mouth 3 (three) times daily as needed for cough. 06/08/17   Kellie Shropshire, PA-C  metFORMIN (GLUCOPHAGE) 1000 MG tablet Take 1,000 mg by mouth 2 (two) times daily with a meal.    [provider]  naproxen (NAPROSYN) 500 MG tablet Take 1 tablet (500 mg total) by mouth 2 (two) times daily. 02/23/17   Arby Barrette, MD  orphenadrine (NORFLEX) 100 MG tablet Take 1 tablet (100 mg total) by mouth 2 (two) times daily. 02/23/17   Arby Barrette, MD  oxyCODONE-acetaminophen (PERCOCET) 10-325 MG tablet Take 1 tablet by mouth at bedtime.    [provider]    Family History Family History  Problem Relation Age of Onset  . Diabetes Other   . Cancer Other   . Hypertension Other     Social History Social History   Tobacco Use  . Smoking status: Never Smoker  . Smokeless tobacco: Never Used  Substance Use Topics  . Alcohol use: No  . Drug use: No     Allergies   Patient has no known allergies.   Review of Systems Review of Systems  Constitutional: Negative for activity change, fatigue and fever.  Respiratory: Negative for chest tightness and shortness of breath.   Cardiovascular: Negative for chest pain.  Gastrointestinal: Negative for abdominal pain, diarrhea, nausea and vomiting.  Genitourinary: Negative for dysuria, frequency, hematuria and urgency.  Musculoskeletal: Positive for back pain, gait problem ( 2/2 pain) and myalgias. Negative for arthralgias, joint swelling, neck pain and neck stiffness.  Skin: Negative for rash.  Allergic/Immunologic: Positive for immunocompromised state.  Neurological: Negative for weakness, light-headedness, numbness and headaches.  All other systems reviewed and  are negative.    Physical Exam Updated Vital Signs BP 137/78 (BP Location: Right Arm)   Pulse 77   Temp 98.1 F (36.7 C) (Oral)   Resp 16   Ht  (1.753 m)   Wt 111.1 kg (245 lb)   SpO2 100%   BMI 36.18 kg/m   Physical Exam  Constitutional: He appears well-developed and well-nourished. No distress.  HENT:  Head: Normocephalic and atraumatic.  Mouth/Throat: Oropharynx is clear and moist. No oropharyngeal exudate.  Eyes: Conjunctivae are normal.  Neck: Normal range of motion. Neck supple.  Full ROM without pain  Cardiovascular: Normal rate, regular rhythm, normal heart sounds and intact distal pulses. Exam reveals no gallop and no friction rub.  No murmur heard. Pulmonary/Chest: Effort normal and breath sounds normal. No stridor. No respiratory distress. He has no wheezes. He has no rales. He exhibits no tenderness.  Abdominal: Soft. He exhibits no distension. There is no tenderness.  Musculoskeletal: Normal range of motion. He exhibits tenderness. He exhibits no edema or deformity.  Full range of motion of the T-spine and L-spine No midline tenderness to the  T-spine or L-spine Tenderness to palpation of the right paraspinous muscles of the L-spine  No tenderness to palpation to the pelvis or bilateral hips.  Lymphadenopathy:    He has no cervical adenopathy.  Neurological: He is alert. He has normal reflexes.  Speech is clear and goal oriented, follows commands Normal 5/5 strength in upper and lower extremities bilaterally including dorsiflexion and plantar flexion, strong and equal grip strength Sensation normal to light and sharp touch Moves extremities without ataxia, coordination intact Normal gait Normal balance   Skin: Skin is warm and dry. No rash noted. He is not diaphoretic. No erythema.  Psychiatric: He has a normal mood and affect. His behavior is normal.  Nursing note and vitals reviewed.  ED Treatments / Results  Labs (all labs ordered are listed,  but only abnormal results are displayed) Labs Reviewed - No data to display  EKG None  Radiology No results found.  Procedures Procedures (including critical care time)  Medications Ordered in ED Medications  dexamethasone (DECADRON) injection 10 mg (has no administration in time range)     Initial Impression / Assessment and Plan / ED Course  I have reviewed the triage vital signs and the nursing notes.  Pertinent labs & imaging results that were available during my care of the patient were reviewed by me and considered in my medical decision making (see chart for details).     43 year old male with a history of diabetes mellitus type 2, TIA, and low back pain who presents with back pain. Patient with back pain, mechanical in nature.  No neurological deficits and normal neuro exam.  Patient can walk but states it is painful.  No urinary or bowel incontinence.  Medical records reviewed.  MRI of lumbar spine in 2012 with L5-S1 with minimally bulging disc and bilateral  L5 pars interarticularis defects without associated anterolithesis of L5 on S1.  At this time, I feel that no further urgent or emergent imaging is required.  No concern for cauda equina, infection, AAA, infection, or fracture.  No constitutional symptoms including fever, chills, or weight loss,  No h/o cancer, IVDU.  Will discharge to home with RICE protocol and anti-inflammatory medicine. Discussed ED return precaution and indications for PCP follow up. The patient acknowledges the plan and is agreeable at this time.        Final Clinical Impressions(s) / ED Diagnoses   Final diagnoses:  Acute myofascial strain of lumbar region, initial encounter    ED Discharge Orders    None       Barkley Boards, PA-C 08/09/17 0956    Tilden Fossa, MD 08/10/17 [phone]

## 2017-11-14 ENCOUNTER — Emergency Department (HOSPITAL_BASED_OUTPATIENT_CLINIC_OR_DEPARTMENT_OTHER)
Admission: EM | Admit: 2017-11-14 | Discharge: 2017-11-14 | Disposition: A | Discharge status: Home / Self Care | Payer: BLUE CROSS/BLUE SHIELD | Attending: Emergency Medicine | Admitting: Emergency Medicine

## 2017-11-14 ENCOUNTER — Other Ambulatory Visit: Payer: Self-pay

## 2017-11-14 ENCOUNTER — Encounter (HOSPITAL_BASED_OUTPATIENT_CLINIC_OR_DEPARTMENT_OTHER): Payer: Self-pay

## 2017-11-14 DIAGNOSIS — R05 Cough: Secondary | ICD-10-CM

## 2017-11-14 DIAGNOSIS — Z8673 Personal history of transient ischemic attack (TIA), and cerebral infarction without residual deficits: Secondary | ICD-10-CM | POA: Diagnosis not present

## 2017-11-14 DIAGNOSIS — J069 Acute upper respiratory infection, unspecified: Secondary | ICD-10-CM

## 2017-11-14 DIAGNOSIS — E119 Type 2 diabetes mellitus without complications: Secondary | ICD-10-CM | POA: Diagnosis not present

## 2017-11-14 DIAGNOSIS — Z7984 Long term (current) use of oral hypoglycemic drugs: Secondary | ICD-10-CM | POA: Insufficient documentation

## 2017-11-14 DIAGNOSIS — R059 Cough, unspecified: Secondary | ICD-10-CM

## 2017-11-14 DIAGNOSIS — Z79899 Other long term (current) drug therapy: Secondary | ICD-10-CM | POA: Diagnosis not present

## 2017-11-14 MED ORDER — FLUTICASONE PROPIONATE 50 MCG/ACT NA SUSP: 1.0000 | Freq: Every day | NASAL | 0 refills | Status: DC

## 2017-11-14 MED ORDER — AMOXICILLIN-POT CLAVULANATE 875-125 MG PO TABS: 1.0000 | ORAL_TABLET | Freq: Two times a day (BID) | ORAL | 0 refills | Status: AC

## 2017-11-14 NOTE — ED Triage Notes (Signed)
C/o flu like sx x 4 days-NAD-steady gait 

## 2017-11-14 NOTE — Discharge Instructions (Addendum)
Take antibiotics as prescribed.  Take the entire course, even if your symptoms improve. Use Tylenol or ibuprofen as needed for fevers or body aches. Use Flonase daily for nasal congestion and cough. You may try cetirizine/zyrtec to help with congestion/inflammation/mucous production.  Make sure you stay well-hydrated with water. Wash your hands frequently to prevent spread of infection. Follow-up with your primary care doctor in 1 week if your symptoms are not improving. Return to the emergency room if you develop chest pain, difficulty breathing, or any new or worsening symptoms.

## 2017-11-14 NOTE — ED Provider Notes (Signed)
[facility] EMERGENCY DEPARTMENT Provider Note   CSN: [id number] Arrival date & time: 11/14/17  1947     History   Chief Complaint Chief Complaint  Patient presents with  . Cough    HPI Dale Reed is a 42 y.o. male presenting for evaluation of cough, nasal congestion, denies body aches.  Patient states that his symptoms began 6 days ago.  He has congestion and a nonproductive cough.  He initially had a sore throat, although this has resolved.  He denies fevers, chills, ear pain, eye pain, chest pain, shortness of breath, nausea, vomiting, abdominal pain.  Patient states he is a history of diabetes which is well controlled with metformin.  Sugars have been at baseline this past week.  He has no other medical problems, takes no other medications daily.  Patient states he has been trying lots of over-the-counter medications including Mucinex, Benadryl, Alka-Seltzer cold and flu, and Tylenol without improvement of symptoms.  He denies sick contacts.  He denies cigarette, alcohol, or drug use.  HPI  Past Medical History:  Diagnosis Date  . Back pain   . Diabetes mellitus     Patient Active Problem List   Diagnosis Date Noted  . TIA (transient ischemic attack) 04/26/2016  . Type 2 diabetes mellitus without complication, without long-term current use of insulin [facility])     Past Surgical History:  Procedure Laterality Date  . APPENDECTOMY          Home Medications    Prior to Admission medications   Medication Sig Start Date End Date Taking? Authorizing Provider  amoxicillin-clavulanate (AUGMENTIN) 875-125 MG tablet Take 1 tablet by mouth every 12 (twelve) hours for 10 days. 11/14/17 11/24/17  Joannie Medine, PA-C  fluticasone (FLONASE) 50 MCG/ACT nasal spray Place 1 spray into both nostrils daily. 11/14/17   Gillian Kluever, PA-C  glipiZIDE (GLUCOTROL) 10 MG tablet Take 10 mg by mouth 2 (two) times daily before a meal.    [provider]  ibuprofen  (ADVIL,MOTRIN) 800 MG tablet Take 1 tablet (800 mg total) by mouth 3 (three) times daily. 08/09/17   McDonald, Mia A, PA-C  metFORMIN (GLUCOPHAGE) 1000 MG tablet Take 1,000 mg by mouth 2 (two) times daily with a meal.    [provider]  methocarbamol (ROBAXIN) 500 MG tablet Take 3 tablets 4 times daily as needed for 2 to 3 days then decrease to 2 tablets 4 times daily as needed 08/09/17   McDonald, Mia A, PA-C  naproxen (NAPROSYN) 500 MG tablet Take 1 tablet (500 mg total) by mouth 2 (two) times daily. 02/23/17   Arby Barrette, MD  orphenadrine (NORFLEX) 100 MG tablet Take 1 tablet (100 mg total) by mouth 2 (two) times daily. 02/23/17   Arby Barrette, MD    Family History Family History  Problem Relation Age of Onset  . Diabetes Other   . Cancer Other   . Hypertension Other     Social History Social History   Tobacco Use  . Smoking status: Never Smoker  . Smokeless tobacco: Never Used  Substance Use Topics  . Alcohol use: No  . Drug use: No     Allergies   Patient has no known allergies.   Review of Systems Review of Systems  Constitutional: Negative for fever.  HENT: Positive for congestion and sore throat (Resolved).   Respiratory: Positive for cough.      Physical Exam Updated Vital Signs BP 136/88 (BP Location: Left Arm)   Pulse 69  Temp 98.5 F (36.9 C) (Oral)   Resp 18   Wt 107.3 kg   SpO2 99%   BMI 34.92 kg/m   Physical Exam  Constitutional: He is oriented to person, place, and time. He appears well-developed and well-nourished. No distress.  Sitting comfortably in the bed in no acute distress  HENT:  Head: Normocephalic and atraumatic.  Right Ear: Tympanic membrane, external ear and ear canal normal.  Left Ear: Tympanic membrane, external ear and ear canal normal.  Nose: Mucosal edema present. Right sinus exhibits no maxillary sinus tenderness and no frontal sinus tenderness. Left sinus exhibits no maxillary sinus tenderness and no  frontal sinus tenderness.  Mouth/Throat: Uvula is midline, oropharynx is clear and moist and mucous membranes are normal. No tonsillar exudate.  Nasal mucosal edema.  OP clear without tonsillar swelling or exudate.  Uvula midline with equal palate rise.  TMs nonerythematous and nonbulging bilaterally.  No tenderness to palpation of the sinuses.  Eyes: Pupils are equal, round, and reactive to light. Conjunctivae and EOM are normal.  Neck: Normal range of motion.  Cardiovascular: Normal rate, regular rhythm and intact distal pulses.  Pulmonary/Chest: Effort normal and breath sounds normal. He has no decreased breath sounds. He has no wheezes. He has no rhonchi. He has no rales.  Pt speaking in full sentences without difficulty.  Clear lung sounds in all fields  Abdominal: Soft. He exhibits no distension. There is no tenderness.  Musculoskeletal: Normal range of motion.  Lymphadenopathy:    He has no cervical adenopathy.  Neurological: He is alert and oriented to person, place, and time.  Skin: Skin is warm. Capillary refill takes less than 2 seconds.  Psychiatric: He has a normal mood and affect.  Nursing note and vitals reviewed.    ED Treatments / Results  Labs (all labs ordered are listed, but only abnormal results are displayed) Labs Reviewed - No data to display  EKG None  Radiology No results found.  Procedures Procedures (including critical care time)  Medications Ordered in ED Medications - No data to display   Initial Impression / Assessment and Plan / ED Course  I have reviewed the triage vital signs and the nursing notes.  Pertinent labs & imaging results that were available during my care of the patient were reviewed by me and considered in my medical decision making (see chart for details).     Patient presenting with a 6 day history of URI symptoms.  Physical exam reassuring, patient is afebrile and appears nontoxic.  Pulmonary exam reassuring.  Doubt  pneumonia, strep, or peritonsillar abscess. However, pt at higher risk due to DM, will give course of abx to cover for possible bacterial infection. Will also treat symptomatically.  Patient to follow-up with primary care as needed.  At this time, patient appears safe for discharge.  Return precautions given.  Patient states he understande and agrees to plan.   Final Clinical Impressions(s) / ED Diagnoses   Final diagnoses:  Cough  Upper respiratory tract infection, unspecified type    ED Discharge Orders         Ordered    amoxicillin-clavulanate (AUGMENTIN) 875-125 MG tablet  Every 12 hours     11/14/17 2142    fluticasone (FLONASE) 50 MCG/ACT nasal spray  Daily     11/14/17 2142           Alveria Apley, PA-C 11/14/17 2353    Gwyneth Sprout, MD 11/15/17 1512

## 2017-11-14 NOTE — ED Notes (Signed)
ED Provider at bedside. 

## 2017-12-26 ENCOUNTER — Emergency Department (HOSPITAL_COMMUNITY)
Admission: EM | Admit: 2017-12-26 | Discharge: 2017-12-26 | Disposition: A | Discharge status: Home / Self Care | Payer: 59 | Attending: Emergency Medicine | Admitting: Emergency Medicine

## 2017-12-26 ENCOUNTER — Encounter (HOSPITAL_COMMUNITY): Payer: Self-pay

## 2017-12-26 ENCOUNTER — Other Ambulatory Visit: Payer: Self-pay

## 2017-12-26 DIAGNOSIS — Z7984 Long term (current) use of oral hypoglycemic drugs: Secondary | ICD-10-CM | POA: Insufficient documentation

## 2017-12-26 DIAGNOSIS — K529 Noninfective gastroenteritis and colitis, unspecified: Secondary | ICD-10-CM | POA: Insufficient documentation

## 2017-12-26 DIAGNOSIS — R112 Nausea with vomiting, unspecified: Secondary | ICD-10-CM | POA: Diagnosis present

## 2017-12-26 DIAGNOSIS — Z79899 Other long term (current) drug therapy: Secondary | ICD-10-CM | POA: Diagnosis not present

## 2017-12-26 DIAGNOSIS — E119 Type 2 diabetes mellitus without complications: Secondary | ICD-10-CM | POA: Insufficient documentation

## 2017-12-26 LAB — COMPREHENSIVE METABOLIC PANEL
ALT: 21 U/L (ref 0–44)
AST: 13 U/L — ABNORMAL LOW (ref 15–41)
Albumin: 3.6 g/dL (ref 3.5–5.0)
Alkaline Phosphatase: 77 U/L (ref 38–126)
Anion gap: 11 (ref 5–15)
BUN: 15 mg/dL (ref 6–20)
CHLORIDE: 103 mmol/L (ref 98–111)
CO2: 24 mmol/L (ref 22–32)
Calcium: 8.6 mg/dL — ABNORMAL LOW (ref 8.9–10.3)
Creatinine, Ser: 0.93 mg/dL (ref 0.61–1.24)
Glucose, Bld: 284 mg/dL — ABNORMAL HIGH (ref 70–99)
POTASSIUM: 3.8 mmol/L (ref 3.5–5.1)
Sodium: 138 mmol/L (ref 135–145)
Total Bilirubin: 1 mg/dL (ref 0.3–1.2)
Total Protein: 6.3 g/dL — ABNORMAL LOW (ref 6.5–8.1)

## 2017-12-26 LAB — CBC
HCT: 50.7 % (ref 39.0–52.0)
Hemoglobin: 17 g/dL (ref 13.0–17.0)
MCH: 28.3 pg (ref 26.0–34.0)
MCHC: 33.5 g/dL (ref 30.0–36.0)
MCV: 84.5 fL (ref 80.0–100.0)
PLATELETS: 248 10*3/uL (ref 150–400)
RBC: 6 MIL/uL — ABNORMAL HIGH (ref 4.22–5.81)
RDW: 12.2 % (ref 11.5–15.5)
WBC: 6.9 10*3/uL (ref 4.0–10.5)
nRBC: 0 % (ref 0.0–0.2)

## 2017-12-26 LAB — I-STAT TROPONIN, ED: Troponin i, poc: 0 ng/mL (ref 0.00–0.08)

## 2017-12-26 MED ORDER — ONDANSETRON HCL 4 MG/2ML IJ SOLN
4.0000 mg | Freq: Once | INTRAMUSCULAR | Status: AC
  Administered 2017-12-26: 4 mg via INTRAVENOUS
  Filled 2017-12-26: qty 2

## 2017-12-26 MED ORDER — SODIUM CHLORIDE 0.9 % IV BOLUS
1000.0000 mL | Freq: Once | INTRAVENOUS | Status: AC
  Administered 2017-12-26: 1000 mL via INTRAVENOUS

## 2017-12-26 MED ORDER — ONDANSETRON 8 MG PO TBDP: 8.0000 mg | ORAL_TABLET | Freq: Three times a day (TID) | ORAL | 0 refills | Status: DC | PRN

## 2017-12-26 NOTE — ED Provider Notes (Addendum)
[facility] EMERGENCY DEPARTMENT Provider Note   CSN: [id number] Arrival date & time: 12/26/17  4098     History   Chief Complaint Chief Complaint  Patient presents with  . Emesis    HPI Dale Reed is a 42 y.o. male.  Patient c/o several episodes of nausea and vomiting this AM. Emesis clear or color recently ingested liquids. Symptoms acute onset, episodic, persistent. Post vomiting, noted discomfort in mid chest, dull, non radiating, constant, not pleuritic. No other recent cp or discomfort, even w exertion. No associated sob or diaphoresis. No abd pain. Is having normal bms, no abd distension. No known ill contacts or bad food ingestion.   The history is provided by the patient.  Emesis   Pertinent negatives include no abdominal pain, no cough, no fever and no headaches.    Past Medical History:  Diagnosis Date  . Back pain   . Diabetes mellitus     Patient Active Problem List   Diagnosis Date Noted  . TIA (transient ischemic attack) 04/26/2016  . Type 2 diabetes mellitus without complication, without long-term current use of insulin [facility])     Past Surgical History:  Procedure Laterality Date  . APPENDECTOMY          Home Medications    Prior to Admission medications   Medication Sig Start Date End Date Taking? Authorizing Provider  fluticasone (FLONASE) 50 MCG/ACT nasal spray Place 1 spray into both nostrils daily. 11/14/17   Caccavale, Sophia, PA-C  glipiZIDE (GLUCOTROL) 10 MG tablet Take 10 mg by mouth 2 (two) times daily before a meal.    [provider]  ibuprofen (ADVIL,MOTRIN) 800 MG tablet Take 1 tablet (800 mg total) by mouth 3 (three) times daily. 08/09/17   McDonald, Mia A, PA-C  metFORMIN (GLUCOPHAGE) 1000 MG tablet Take 1,000 mg by mouth 2 (two) times daily with a meal.    [provider]  methocarbamol (ROBAXIN) 500 MG tablet Take 3 tablets 4 times daily as needed for 2 to 3 days then decrease to 2 tablets 4  times daily as needed 08/09/17   McDonald, Mia A, PA-C  naproxen (NAPROSYN) 500 MG tablet Take 1 tablet (500 mg total) by mouth 2 (two) times daily. 02/23/17   Arby Barrette, MD  orphenadrine (NORFLEX) 100 MG tablet Take 1 tablet (100 mg total) by mouth 2 (two) times daily. 02/23/17   Arby Barrette, MD    Family History Family History  Problem Relation Age of Onset  . Diabetes Other   . Cancer Other   . Hypertension Other     Social History Social History   Tobacco Use  . Smoking status: Never Smoker  . Smokeless tobacco: Never Used  Substance Use Topics  . Alcohol use: No  . Drug use: No     Allergies   Patient has no known allergies.   Review of Systems Review of Systems  Constitutional: Negative for fever.  HENT: Negative for sore throat.   Eyes: Negative for redness.  Respiratory: Negative for cough and shortness of breath.   Cardiovascular: Negative for leg swelling.  Gastrointestinal: Positive for vomiting. Negative for abdominal pain.  Genitourinary: Negative for flank pain.  Musculoskeletal: Negative for back pain and neck pain.  Skin: Negative for rash.  Neurological: Negative for headaches.  Hematological: Does not bruise/bleed easily.  Psychiatric/Behavioral: Negative for confusion.     Physical Exam Updated Vital Signs There were no vitals taken for this visit.  Physical Exam  Constitutional: He appears well-developed and well-nourished.  HENT:  Mouth/Throat: Oropharynx is clear and moist.  Eyes: Conjunctivae are normal. No scleral icterus.  Neck: Neck supple. No tracheal deviation present.  Cardiovascular: Normal rate, regular rhythm and intact distal pulses. Exam reveals no gallop and no friction rub.  No murmur heard. Pulmonary/Chest: Effort normal and breath sounds normal. No accessory muscle usage. No respiratory distress.  Abdominal: Soft. Bowel sounds are normal. He exhibits no distension and no mass. There is no tenderness. There is no  rebound and no guarding. No hernia.  Genitourinary:  Genitourinary Comments: No cva tenderness.   Musculoskeletal: He exhibits no edema.  Neurological: He is alert.  Skin: Skin is warm and dry. No rash noted.  Psychiatric: He has a normal mood and affect.  Nursing note and vitals reviewed.    ED Treatments / Results  Labs (all labs ordered are listed, but only abnormal results are displayed) Results for orders placed or performed during the hospital encounter of 12/26/17  CBC  Result Value Ref Range   WBC 6.9 4.0 - 10.5 K/uL   RBC 6.00 (H) 4.22 - 5.81 MIL/uL   Hemoglobin 17.0 13.0 - 17.0 g/dL   HCT 16.1 09.6 - 04.5 %   MCV 84.5 80.0 - 100.0 fL   MCH 28.3 26.0 - 34.0 pg   MCHC 33.5 30.0 - 36.0 g/dL   RDW 40.9 81.1 - 91.4 %   Platelets 248 150 - 400 K/uL   nRBC 0.0 0.0 - 0.2 %  Comprehensive metabolic panel  Result Value Ref Range   Sodium 138 135 - 145 mmol/L   Potassium 3.8 3.5 - 5.1 mmol/L   Chloride 103 98 - 111 mmol/L   CO2 24 22 - 32 mmol/L   Glucose, Bld 284 (H) 70 - 99 mg/dL   BUN 15 6 - 20 mg/dL   Creatinine, Ser 7.82 0.61 - 1.24 mg/dL   Calcium 8.6 (L) 8.9 - 10.3 mg/dL   Total Protein 6.3 (L) 6.5 - 8.1 g/dL   Albumin 3.6 3.5 - 5.0 g/dL   AST 13 (L) 15 - 41 U/L   ALT 21 0 - 44 U/L   Alkaline Phosphatase 77 38 - 126 U/L   Total Bilirubin 1.0 0.3 - 1.2 mg/dL   GFR calc non Af Amer >60 >60 mL/min   GFR calc Af Amer >60 >60 mL/min   Anion gap 11 5 - 15  I-stat troponin, ED  Result Value Ref Range   Troponin i, poc 0.00 0.00 - 0.08 ng/mL   Comment 3            EKG EKG Interpretation  Date/Time:  Monday December 26 2017 09:54:16 EDT Ventricular Rate:  101 PR Interval:    QRS Duration: 101 QT Interval:  334 QTC Calculation: 433 R Axis:   -6 Text Interpretation:  Sinus tachycardia Incomplete RBBB and LAFB No significant change since last tracing Confirmed by Cathren Laine ([id number]) on 12/26/2017 10:03:00 AM   Radiology No results  found.  Procedures Procedures (including critical care time)  Medications Ordered in ED Medications  sodium chloride 0.9 % bolus 1,000 mL (has no administration in time range)  ondansetron (ZOFRAN) injection 4 mg (has no administration in time range)     Initial Impression / Assessment and Plan / ED Course  I have reviewed the triage vital signs and the nursing notes.  Pertinent labs & imaging results that were available during my care of the patient were reviewed by  me and considered in my medical decision making (see chart for details).  Iv ns 1 liter bolus. zofran iv. Labs.  Reviewed nursing notes and prior charts for additional history.   2nd liter ns bolus.  Labs reviewed - hco3, k normal.   Recheck abd soft nt.   No chest pain or discomfort. No sob.   Pt feels improved.   Pt continues to feel improved. No pain or discomfort. Tolerating po fluids well.   Pt currently appears stable for d/c.     Final Clinical Impressions(s) / ED Diagnoses   Final diagnoses:  None    ED Discharge Orders    None          Cathren Laine, MD 12/26/17 1327

## 2017-12-26 NOTE — ED Notes (Signed)
Up to bathroom ,  States he is feeling better , taking po fluids .

## 2017-12-26 NOTE — ED Triage Notes (Signed)
[facility]- pt coming from home with complaint of emesis. He reports chest pain that started after vomiting. He is diabetic, cbg 286. 20g LAC, 200cc ns and 4mg  of zofran.

## 2017-12-26 NOTE — Discharge Instructions (Addendum)
It was our pleasure to provide your ER care today - we hope that you feel better.  Drink plenty of fluids.  Take zofran as need for nausea.  Follow up with primary care doctor in 1-2 days for recheck if symptoms fail to improve/resolve.  Your blood sugar is high today (286) - continue your diabetic medication, drink plenty of  fluids, and follow up with your doctor in the coming week.   Return to ER if worse, new symptoms, persistent vomiting, new or severe abdominal pain, chest pain, trouble breathing, other concern.

## 2017-12-26 NOTE — ED Notes (Signed)
Coke given to drink.  

## 2019-04-03 ENCOUNTER — Telehealth: Payer: Self-pay

## 2019-04-03 NOTE — Telephone Encounter (Signed)
NOTES ON FILE FROM [facility] [phone], NOTES SENT TO SCHEDULING

## 2019-04-04 ENCOUNTER — Encounter: Payer: Self-pay | Admitting: Cardiovascular Disease

## 2019-04-04 ENCOUNTER — Ambulatory Visit (INDEPENDENT_AMBULATORY_CARE_PROVIDER_SITE_OTHER): Payer: Self-pay | Admitting: Cardiovascular Disease

## 2019-04-04 ENCOUNTER — Other Ambulatory Visit: Payer: Self-pay

## 2019-04-04 DIAGNOSIS — R079 Chest pain, unspecified: Secondary | ICD-10-CM

## 2019-04-04 DIAGNOSIS — E782 Mixed hyperlipidemia: Secondary | ICD-10-CM

## 2019-04-04 DIAGNOSIS — E785 Hyperlipidemia, unspecified: Secondary | ICD-10-CM | POA: Insufficient documentation

## 2019-04-04 NOTE — Patient Instructions (Addendum)
Medication Instructions:  Your physician recommends that you continue on your current medications as directed. Please refer to the Current Medication list given to you today.  *If you need a refill on your cardiac medications before your next appointment, please call your pharmacy*  Lab Work: None If you have labs (blood work) drawn today and your tests are completely normal, you will receive your results only by: Marland Kitchen MyChart Message (if you have MyChart) OR . A paper copy in the mail If you have any lab test that is abnormal or we need to change your treatment, we will call you to review the results.  Testing/Procedures: None  Follow-Up: At [facility], you and your health needs are our priority.  As part of our continuing mission to provide you with exceptional heart care, we have created designated Provider Care Teams.  These Care Teams include your primary Cardiologist (physician) and Advanced Practice Providers (APPs -  Physician Assistants and Nurse Practitioners) who all work together to provide you with the care you need, when you need it.  Your next appointment:   1 month(s)  The format for your next appointment:   In Person  Provider:   You may see Dr. Kristeen Miss or one of the following Advanced Practice Providers on your designated Care Team:    Tereso Newcomer, PA-C  Vin [place], [place]  Berton Bon, NP   Other Instructions   Your physician recommends that you follow a [facility] Diet.

## 2019-04-04 NOTE — Progress Notes (Signed)
Cardiology Office Note:    Date:  04/04/2019   ID:  Dale Reed, DOB 04/16/1975, MRN [id number]  PCP:  [facility]  Cardiologist:  Rishawn Walck   Electrophysiologist:  None   Referring MD: Quitman Livings, MD   Chief Complaint  Patient presents with  . Chest Pain    History of Present Illness:    Dale Reed is a 44 y.o. male with a hx of  Chest pain  Hx of DM and HLD    CP for the past month Left sided ,  Shooting pain , off and on Occasionally a constant nagging aches.   Can last for hours.  Not worsened with walking Worse with lying down.  Sometimes worse with deep breath  No real exercise Is active on weekends ( races gocarts )   Pain is not related to lifting or pulling  Worse with cough  No fever,  Radiates up to left shoulder and down arms, no tingling Up to left side of head   Does not pay attention to his diet.  Lots of stress Mom passed away 3 months ago .  Has not tried any chol meds.  Was laid off last oct  Eats a terrible diet  Fast food , sweat tea , bread, biscuits  For all 3 meals .  Frequently really goes to cookout in the late night for a fourth meal. Does not exercise   Past Medical History:  Diagnosis Date  . Back pain   . Chest pain   . Diabetes mellitus   . SOB (shortness of breath)   . TIA (transient ischemic attack) 04/26/2016    Past Surgical History:  Procedure Laterality Date  . APPENDECTOMY      Current Medications: Current Meds  Medication Sig  . fluticasone (FLONASE) 50 MCG/ACT nasal spray Place 1 spray into both nostrils daily.  Marland Kitchen glipiZIDE (GLUCOTROL) 10 MG tablet Take 10 mg by mouth 2 (two) times daily before a meal.  . ibuprofen (ADVIL,MOTRIN) 800 MG tablet Take 1 tablet (800 mg total) by mouth 3 (three) times daily.  . metFORMIN (GLUCOPHAGE) 1000 MG tablet Take 1,000 mg by mouth 2 (two) times daily with a meal.  . methocarbamol (ROBAXIN) 500 MG tablet Take 3 tablets 4 times daily as needed for 2 to 3 days  then decrease to 2 tablets 4 times daily as needed  . naproxen (NAPROSYN) 500 MG tablet Take 1 tablet (500 mg total) by mouth 2 (two) times daily.  . ondansetron (ZOFRAN ODT) 8 MG disintegrating tablet Take 1 tablet (8 mg total) by mouth every 8 (eight) hours as needed for nausea or vomiting.  . orphenadrine (NORFLEX) 100 MG tablet Take 1 tablet (100 mg total) by mouth 2 (two) times daily.     Allergies:   Patient has no known allergies.   Social History   Socioeconomic History  . Marital status: Single    Spouse name: Not on file  . Number of children: Not on file  . Years of education: Not on file  . Highest education level: Not on file  Occupational History  . Not on file  Tobacco Use  . Smoking status: Never Smoker  . Smokeless tobacco: Never Used  Substance and Sexual Activity  . Alcohol use: No  . Drug use: No  . Sexual activity: Not on file  Other Topics Concern  . Not on file  Social History Narrative  . Not on file   Social Determinants of Health  Financial Resource Strain:   . Difficulty of Paying Living Expenses: Not on file  Food Insecurity:   . Worried About Charity fundraiser in the Last Year: Not on file  . Ran Out of Food in the Last Year: Not on file  Transportation Needs:   . Lack of Transportation (Medical): Not on file  . Lack of Transportation (Non-Medical): Not on file  Physical Activity:   . Days of Exercise per Week: Not on file  . Minutes of Exercise per Session: Not on file  Stress:   . Feeling of Stress : Not on file  Social Connections:   . Frequency of Communication with Friends and Family: Not on file  . Frequency of Social Gatherings with Friends and Family: Not on file  . Attends Religious Services: Not on file  . Active Member of Clubs or Organizations: Not on file  . Attends Archivist Meetings: Not on file  . Marital Status: Not on file     Family History: The patient's family history includes Cancer in an other  family member; Diabetes in an other family member; Hypertension in an other family member.  ROS:   Please see the history of present illness.     All other systems reviewed and are negative.  EKGs/Labs/Other Studies Reviewed:    The following studies were reviewed today:   EKG:  Jan. 20, 2021   Recent Labs: No results found for requested labs within last 8760 hours.  Recent Lipid Panel No results found for: CHOL, TRIG, HDL, CHOLHDL, VLDL, LDLCALC, LDLDIRECT  Physical Exam:    VS:  BP 118/76   Pulse 87   Ht 5\' 9"  (1.753 m)   Wt 243 lb (110.2 kg)   SpO2 97%   BMI 35.88 kg/m     Wt Readings from Last 3 Encounters:  04/04/19 243 lb (110.2 kg)  11/14/17 236 lb 8 oz (107.3 kg)  08/09/17 245 lb (111.1 kg)     GEN: middle age, obese male,  HEENT: Normal NECK: No JVD; No carotid bruits LYMPHATICS: No lymphadenopathy CARDIAC:  RR, no murmur,  No chest wall tenderness  RESPIRATORY:  Clear to auscultation without rales, wheezing or rhonchi  ABDOMEN: Soft, non-tender, non-distended MUSCULOSKELETAL:  No edema; No deformity  SKIN: Warm and dry NEUROLOGIC:  Alert and oriented x 3 PSYCHIATRIC:  Normal affect   ASSESSMENT:    1. Chest pain of uncertain etiology   2. Mixed hyperlipidemia    PLAN:      1. Chest pain :    His chest pain is rather atypical.  Is not related to exercise.  There is occasional sharp pains and pains that go on for hours then resolved.  I suspect that it is his very poor diet.'s eats fast food 3-4 times a day.  He drinks sweet tea 3-4 times a day.  He makes no effort to exercise.  His chest pains do not sound cardiac.  I suspect that it is because of his very poor diet.  He is probably having gastritis or other similar symptoms.  Pains do not worsen with exertion.  He agreed to start with improvement of his diet and then see if the pains go away.  We had a long discussion about improving his diet.  We will give him the [facility] diet as a reference point.   Ive asked him to exercise.  We will see him back in the office in 1 month for follow-up visit.  If  he continues to have pain then we can proceed with additional evaluation.   2.  Hyperlipidemia :  Trigs are very elevated.  He drinks a large sweet tea with every meal and sometimes at night.  Eats lots of breath and biscuits.    Advised  More exercise   3.   Diabetes mellitus :   Has had DM II for the past 15 years  He drinks a large sweet tea with every meal and sometimes at night.  Eats lots of breath and biscuits.      Medication Adjustments/Labs and Tests Ordered: Current medicines are reviewed at length with the patient today.  Concerns regarding medicines are outlined above.  No orders of the defined types were placed in this encounter.  No orders of the defined types were placed in this encounter.   Patient Instructions  Medication Instructions:  Your physician recommends that you continue on your current medications as directed. Please refer to the Current Medication list given to you today.  *If you need a refill on your cardiac medications before your next appointment, please call your pharmacy*  Lab Work: None If you have labs (blood work) drawn today and your tests are completely normal, you will receive your results only by: Marland Kitchen MyChart Message (if you have MyChart) OR . A paper copy in the mail If you have any lab test that is abnormal or we need to change your treatment, we will call you to review the results.  Testing/Procedures: None  Follow-Up: At [facility], you and your health needs are our priority.  As part of our continuing mission to provide you with exceptional heart care, we have created designated Provider Care Teams.  These Care Teams include your primary Cardiologist (physician) and Advanced Practice Providers (APPs -  Physician Assistants and Nurse Practitioners) who all work together to provide you with the care you need, when you need it.  Your  next appointment:   1 month(s)  The format for your next appointment:   In Person  Provider:   You may see Dr. Kristeen Miss or one of the following Advanced Practice Providers on your designated Care Team:    Tereso Newcomer, PA-C  Vin [place], [place]  Berton Bon, NP   Other Instructions   Your physician recommends that you follow a [facility] Diet.     Signed, Kristeen Miss, MD  04/04/2019 6:04 PM    [facility]

## 2019-04-18 NOTE — Addendum Note (Signed)
Addended by: Melanee Spry on: 04/18/2019 03:58 PM   Modules accepted: Orders

## 2019-05-07 ENCOUNTER — Ambulatory Visit: Payer: Self-pay | Admitting: Cardiovascular Disease

## 2020-02-29 ENCOUNTER — Other Ambulatory Visit: Payer: Self-pay

## 2020-02-29 ENCOUNTER — Ambulatory Visit
Admission: EM | Admit: 2020-02-29 | Discharge: 2020-02-29 | Disposition: A | Discharge status: Home / Self Care | Payer: BC Managed Care – PPO | Attending: Urgent Care | Admitting: Urgent Care

## 2020-02-29 DIAGNOSIS — B9789 Other viral agents as the cause of diseases classified elsewhere: Secondary | ICD-10-CM

## 2020-02-29 DIAGNOSIS — M7581 Other shoulder lesions, right shoulder: Secondary | ICD-10-CM

## 2020-02-29 DIAGNOSIS — M25511 Pain in right shoulder: Secondary | ICD-10-CM

## 2020-02-29 MED ORDER — CETIRIZINE HCL 10 MG PO TABS: 10.0000 mg | ORAL_TABLET | Freq: Every day | ORAL | 0 refills | Status: AC

## 2020-02-29 MED ORDER — FLUTICASONE PROPIONATE 50 MCG/ACT NA SUSP: 2.0000 | Freq: Every day | NASAL | 0 refills | Status: AC

## 2020-02-29 MED ORDER — PSEUDOEPHEDRINE HCL 60 MG PO TABS: 60.0000 mg | ORAL_TABLET | Freq: Three times a day (TID) | ORAL | 0 refills | Status: AC | PRN

## 2020-02-29 MED ORDER — TIZANIDINE HCL 4 MG PO TABS: 4.0000 mg | ORAL_TABLET | Freq: Four times a day (QID) | ORAL | 0 refills | Status: AC | PRN

## 2020-02-29 MED ORDER — NAPROXEN 500 MG PO TABS: 500.0000 mg | ORAL_TABLET | Freq: Two times a day (BID) | ORAL | 0 refills | Status: AC

## 2020-02-29 NOTE — ED Triage Notes (Signed)
Pt c/o rt shoulder pain for 3wks. Denies injury. States went to primary care this am and dx with tendinitis but feels like it may be something else. States didn't fill the mobic.prescribed. states had a neg x-ray.

## 2020-02-29 NOTE — ED Provider Notes (Signed)
[facility]   MRN: [id number] DOB: 1976-02-16  Subjective:   Dale Reed is a 44 y.o. male presenting for 3-week history of persistent right shoulder pain. Right shoulder x-ray from 2017 was completely normal.  He also had another shoulder x-ray today with his PCP and was prescribed meloxicam.  Patient came here to get another opinion.  Denies any falls, trauma.  He does strenuous work, lifts heavy items regularly.  He has also had the start of a scratchy throat, runny nose.  Denies smoking history.  Denies fever, sinus pain, ear pain, chest pain, shortness of breath, body aches.  No COVID vaccination.  Does not want Covid testing.  No current facility-administered medications for this encounter.  Current Outpatient Medications:  .  fluticasone (FLONASE) 50 MCG/ACT nasal spray, Place 1 spray into both nostrils daily., Disp: 16 g, Rfl: 0 .  glipiZIDE (GLUCOTROL) 10 MG tablet, Take 10 mg by mouth 2 (two) times daily before a meal., Disp: , Rfl:  .  ibuprofen (ADVIL,MOTRIN) 800 MG tablet, Take 1 tablet (800 mg total) by mouth 3 (three) times daily., Disp: 21 tablet, Rfl: 0 .  metFORMIN (GLUCOPHAGE) 1000 MG tablet, Take 1,000 mg by mouth 2 (two) times daily with a meal., Disp: , Rfl:  .  methocarbamol (ROBAXIN) 500 MG tablet, Take 3 tablets 4 times daily as needed for 2 to 3 days then decrease to 2 tablets 4 times daily as needed, Disp: 40 tablet, Rfl: 0 .  naproxen (NAPROSYN) 500 MG tablet, Take 1 tablet (500 mg total) by mouth 2 (two) times daily., Disp: 30 tablet, Rfl: 0 .  ondansetron (ZOFRAN ODT) 8 MG disintegrating tablet, Take 1 tablet (8 mg total) by mouth every 8 (eight) hours as needed for nausea or vomiting., Disp: 10 tablet, Rfl: 0 .  orphenadrine (NORFLEX) 100 MG tablet, Take 1 tablet (100 mg total) by mouth 2 (two) times daily., Disp: 30 tablet, Rfl: 0   No Known Allergies  Past Medical History:  Diagnosis Date  . Back pain   . Chest pain   . Diabetes mellitus   .  SOB (shortness of breath)   . TIA (transient ischemic attack) 04/26/2016     Past Surgical History:  Procedure Laterality Date  . APPENDECTOMY      Family History  Problem Relation Age of Onset  . Diabetes Other   . Cancer Other   . Hypertension Other     Social History   Tobacco Use  . Smoking status: Never Smoker  . Smokeless tobacco: Never Used  Vaping Use  . Vaping Use: Never used  Substance Use Topics  . Alcohol use: No  . Drug use: No    ROS   Objective:   Vitals: BP 134/84 (BP Location: Left Arm)   Pulse 73   Resp 18   SpO2 96%   Physical Exam Constitutional:      General: He is not in acute distress.    Appearance: Normal appearance. He is well-developed and normal weight. He is not ill-appearing, toxic-appearing or diaphoretic.  HENT:     Head: Normocephalic and atraumatic.     Right Ear: Tympanic membrane, ear canal and external ear normal. There is no impacted cerumen.     Left Ear: Tympanic membrane, ear canal and external ear normal. There is no impacted cerumen.     Nose: Nose normal. No congestion or rhinorrhea.     Mouth/Throat:     Mouth: Mucous membranes are moist.  Pharynx: Oropharynx is clear. No oropharyngeal exudate or posterior oropharyngeal erythema.  Eyes:     General: No scleral icterus.       Right eye: No discharge.        Left eye: No discharge.     Extraocular Movements: Extraocular movements intact.     Conjunctiva/sclera: Conjunctivae normal.     Pupils: Pupils are equal, round, and reactive to light.  Cardiovascular:     Rate and Rhythm: Normal rate.  Pulmonary:     Effort: Pulmonary effort is normal.  Musculoskeletal:     Right shoulder: Tenderness (positive Hawkins and Neer tests.) present. No swelling, deformity, effusion, laceration, bony tenderness or crepitus. Decreased range of motion. Normal strength.     Cervical back: Normal range of motion and neck supple. No rigidity. No muscular tenderness.  Neurological:      General: No focal deficit present.     Mental Status: He is alert and oriented to person, place, and time.  Psychiatric:        Mood and Affect: Mood normal.        Behavior: Behavior normal.        Thought Content: Thought content normal.        Judgment: Judgment normal.       Assessment and Plan :   PDMP not reviewed this encounter.  1. Rotator cuff tendinitis, right   2. Pain in joint of right shoulder   3. Viral respiratory infection     Counseled patient on nature of rotator cuff tendinitis and recommended similar treatment plan with his PCP including NSAID, shoulder rehab.  Provided him with information to Ortho in the event that his symptoms persist, can follow-up with him and consider further imaging such as an MRI.  Given his diabetes we will hold off on a steroid course for now.  Also suspect viral URI, viral syndrome; physical exam findings reassuring and vital signs stable for discharge. Advised supportive care, offered symptomatic relief. Counseled patient on potential for adverse effects with medications prescribed/recommended today, ER and return-to-clinic precautions discussed, patient verbalized understanding.     Wallis Bamberg, PA-C 02/29/20 1743

## 2020-04-23 ENCOUNTER — Ambulatory Visit
Admission: EM | Admit: 2020-04-23 | Discharge: 2020-04-23 | Disposition: A | Discharge status: Home / Self Care | Payer: BC Managed Care – PPO

## 2020-04-23 ENCOUNTER — Emergency Department (HOSPITAL_BASED_OUTPATIENT_CLINIC_OR_DEPARTMENT_OTHER): Payer: BC Managed Care – PPO

## 2020-04-23 ENCOUNTER — Emergency Department (HOSPITAL_BASED_OUTPATIENT_CLINIC_OR_DEPARTMENT_OTHER)
Admission: EM | Admit: 2020-04-23 | Discharge: 2020-04-23 | Disposition: A | Discharge status: Home / Self Care | Payer: BC Managed Care – PPO | Attending: Emergency Medicine | Admitting: Emergency Medicine

## 2020-04-23 ENCOUNTER — Other Ambulatory Visit: Payer: Self-pay

## 2020-04-23 ENCOUNTER — Encounter (HOSPITAL_BASED_OUTPATIENT_CLINIC_OR_DEPARTMENT_OTHER): Payer: Self-pay

## 2020-04-23 DIAGNOSIS — E1169 Type 2 diabetes mellitus with other specified complication: Secondary | ICD-10-CM | POA: Diagnosis not present

## 2020-04-23 DIAGNOSIS — E785 Hyperlipidemia, unspecified: Secondary | ICD-10-CM | POA: Insufficient documentation

## 2020-04-23 DIAGNOSIS — G510 Bell's palsy: Secondary | ICD-10-CM | POA: Diagnosis not present

## 2020-04-23 DIAGNOSIS — R29810 Facial weakness: Secondary | ICD-10-CM | POA: Diagnosis present

## 2020-04-23 DIAGNOSIS — Z7984 Long term (current) use of oral hypoglycemic drugs: Secondary | ICD-10-CM | POA: Diagnosis not present

## 2020-04-23 MED ORDER — PREDNISONE 20 MG PO TABS: 60.0000 mg | ORAL_TABLET | Freq: Every day | ORAL | 0 refills | Status: AC

## 2020-04-23 MED ORDER — ARTIFICIAL TEARS OPHTHALMIC OINT: TOPICAL_OINTMENT | Freq: Three times a day (TID) | OPHTHALMIC | 1 refills | Status: AC

## 2020-04-23 MED ORDER — VALACYCLOVIR HCL 1 G PO TABS: 1000.0000 mg | ORAL_TABLET | Freq: Three times a day (TID) | ORAL | 0 refills | Status: AC

## 2020-04-23 NOTE — Discharge Instructions (Addendum)
Take medications as directed. The prednisone will cause your blood sugar to be elevated. Keep right eye lubricated. Follow-up with your primary care provider

## 2020-04-23 NOTE — ED Triage Notes (Signed)
Right side ear pain. Right side eye watering more than left.

## 2020-04-23 NOTE — ED Triage Notes (Signed)
Patient states he has had facial paralysis that has been worsening about 2 days. Right side ear and new feeling new onset headaches on his right side behind his ear.

## 2020-04-23 NOTE — ED Provider Notes (Signed)
[facility]    CSN: [id number] Arrival date & time: 04/23/20  1758      History   Chief Complaint Chief Complaint  Patient presents with  . Facial Paralysis    X several days    HPI Dale Reed is a 45 y.o. male history of DM type II, prior TIA presenting today for evaluation of facial drooping.  Patient reports that over the past 2 days he has noticed progressive paralysis of the right side of his face.  Since onset of drooping he has also developed right-sided headache and ear pain.  He denies any other extremity weakness numbness or tingling.  Denies any facial numbness or tingling.  Prior TIA approximately 4 to 5 years ago.  HPI  Past Medical History:  Diagnosis Date  . Back pain   . Chest pain   . Diabetes mellitus   . SOB (shortness of breath)   . TIA (transient ischemic attack) 04/26/2016    Patient Active Problem List   Diagnosis Date Noted  . Chest pain of uncertain etiology 04/04/2019  . Hyperlipidemia 04/04/2019  . TIA (transient ischemic attack) 04/26/2016  . Type 2 diabetes mellitus without complication, without long-term current use of insulin [facility])     Past Surgical History:  Procedure Laterality Date  . APPENDECTOMY         Home Medications    Prior to Admission medications   Medication Sig Start Date End Date Taking? Authorizing Provider  cetirizine (ZYRTEC ALLERGY) 10 MG tablet Take 1 tablet (10 mg total) by mouth daily. 02/29/20   Wallis Bamberg, PA-C  fluticasone (FLONASE) 50 MCG/ACT nasal spray Place 2 sprays into both nostrils daily. 02/29/20   Wallis Bamberg, PA-C  ibuprofen (ADVIL,MOTRIN) 800 MG tablet Take 1 tablet (800 mg total) by mouth 3 (three) times daily. 08/09/17   McDonald, Mia A, PA-C  metFORMIN (GLUCOPHAGE) 1000 MG tablet Take 1,000 mg by mouth 2 (two) times daily with a meal.    [provider]  naproxen (NAPROSYN) 500 MG tablet Take 1 tablet (500 mg total) by mouth 2 (two) times daily with a meal. 02/29/20    Wallis Bamberg, PA-C  pseudoephedrine (SUDAFED) 60 MG tablet Take 1 tablet (60 mg total) by mouth every 8 (eight) hours as needed for congestion. 02/29/20   Wallis Bamberg, PA-C  tiZANidine (ZANAFLEX) 4 MG tablet Take 1 tablet (4 mg total) by mouth every 6 (six) hours as needed for muscle spasms. 02/29/20   Wallis Bamberg, PA-C    Family History Family History  Problem Relation Age of Onset  . Diabetes Other   . Cancer Other   . Hypertension Other     Social History Social History   Tobacco Use  . Smoking status: Never Smoker  . Smokeless tobacco: Never Used  Vaping Use  . Vaping Use: Never used  Substance Use Topics  . Alcohol use: No  . Drug use: No     Allergies   Patient has no known allergies.   Review of Systems Review of Systems  Constitutional: Negative for fatigue and fever.  Eyes: Negative for redness, itching and visual disturbance.  Respiratory: Negative for shortness of breath.   Cardiovascular: Negative for chest pain and leg swelling.  Gastrointestinal: Negative for nausea and vomiting.  Musculoskeletal: Negative for arthralgias and myalgias.  Skin: Negative for color change, rash and wound.  Neurological: Positive for facial asymmetry. Negative for dizziness, syncope, weakness, light-headedness and headaches.     Physical Exam Triage  Vital Signs ED Triage Vitals  Enc Vitals Group     BP 04/23/20 1819 (!) 138/91     Pulse Rate 04/23/20 1819 84     Resp 04/23/20 1819 17     Temp 04/23/20 1819 98.2 F (36.8 C)     Temp Source 04/23/20 1819 Oral     SpO2 04/23/20 1819 98 %     Weight --      Height --      Head Circumference --      Peak Flow --      Pain Score 04/23/20 1820 6     Pain Loc --      Pain Edu? --      Excl. in GC? --    No data found.  Updated Vital Signs BP (!) 138/91 (BP Location: Left Arm)   Pulse 84   Temp 98.2 F (36.8 C) (Oral)   Resp 17   SpO2 98%   Visual Acuity Right Eye Distance:   Left Eye Distance:   Bilateral  Distance:    Right Eye Near:   Left Eye Near:    Bilateral Near:     Physical Exam Vitals and nursing note reviewed.  Constitutional:      Appearance: He is well-developed and well-nourished.     Comments: No acute distress  HENT:     Head: Normocephalic and atraumatic.     Ears:     Comments: Bilateral ears without tenderness to palpation of external auricle, tragus and mastoid, EAC's without erythema or swelling, TM's with good bony landmarks and cone of light. Non erythematous.     Nose: Nose normal.     Mouth/Throat:     Comments: Palate elevates symmetrically Eyes:     Extraocular Movements: Extraocular movements intact.     Conjunctiva/sclera: Conjunctivae normal.     Pupils: Pupils are equal, round, and reactive to light.  Cardiovascular:     Rate and Rhythm: Normal rate.  Pulmonary:     Effort: Pulmonary effort is normal. No respiratory distress.  Abdominal:     General: There is no distension.  Musculoskeletal:        General: Normal range of motion.     Cervical back: Neck supple.     Comments: Shoulder grip hip and knee strength 5/5 and equal bilaterally Gait without abnormality  Skin:    General: Skin is warm and dry.  Neurological:     Mental Status: He is alert and oriented to person, place, and time.     Comments: Right-sided facial drooping, does have some movement within right eyebrow, but weakness noted throughout right side  Psychiatric:        Mood and Affect: Mood and affect normal.      UC Treatments / Results  Labs (all labs ordered are listed, but only abnormal results are displayed) Labs Reviewed - No data to display  EKG   Radiology No results found.  Procedures Procedures (including critical care time)  Medications Ordered in UC Medications - No data to display  Initial Impression / Assessment and Plan / UC Course  I have reviewed the triage vital signs and the nursing notes.  Pertinent labs & imaging results that were  available during my care of the patient were reviewed by me and considered in my medical decision making (see chart for details).     Right-sided facial paralysis with associated right-sided headaches, does have some movement of right eyebrow given associated headaches and prior  history of prior TIA recommending further evaluation in emergency room for CT scanning to further rule out stroke.  Possible Bell's palsy, but seems less likely given associated headaches.  Declines having someone available to drive him to emergency room.  Patient stable on discharge and sent via private vehicle, patient reports feeling comfortable driving self, advised feeling worse he should pull over and call 911.  Discussed strict return precautions. Patient verbalized understanding and is agreeable with plan.  Final Clinical Impressions(s) / UC Diagnoses   Final diagnoses:  Facial paralysis   Discharge Instructions   None    ED Prescriptions    None     PDMP not reviewed this encounter.   Lew Dawes, [place] 04/23/20 1842

## 2020-04-23 NOTE — ED Triage Notes (Signed)
Pt c/o right side facial droop x 2 days-also c/o HA-states he was seen/sent from [facility]-steady gait

## 2020-04-23 NOTE — ED Notes (Signed)
Pt is aaox3, ambulatory with steady gait. Pt is able to ambulate to bathroom to obtain sample at this time. Pt denies any pain, trouble talking, swallowing, or ambulating.

## 2020-04-23 NOTE — ED Provider Notes (Signed)
[facility] EMERGENCY DEPARTMENT Provider Note   CSN: [id number] Arrival date & time: 04/23/20  1915     History Chief Complaint  Patient presents with  . Facial Droop    Dale Reed is a 44 y.o. male.  Patient seen earlier today at urgent care for evaluation of right sided facial droop, onset yesterday. He reports he was having difficulty drinking liquids last night. This morning, while brushing his teeth, he noticed drooping of the right side of his face, with inability to completely close his right eye. Prior history of TIA, type II diabetes. No weakness of upper or lower extremities. Ambulates without difficulty.  The history is provided by the patient and medical records.  Neurologic Problem This is a new problem. The current episode started yesterday. The problem has been gradually worsening. Associated symptoms include headaches.       Past Medical History:  Diagnosis Date  . Back pain   . Chest pain   . Diabetes mellitus   . SOB (shortness of breath)   . TIA (transient ischemic attack) 04/26/2016    Patient Active Problem List   Diagnosis Date Noted  . Chest pain of uncertain etiology 04/04/2019  . Hyperlipidemia 04/04/2019  . TIA (transient ischemic attack) 04/26/2016  . Type 2 diabetes mellitus without complication, without long-term current use of insulin [facility])     Past Surgical History:  Procedure Laterality Date  . APPENDECTOMY         Family History  Problem Relation Age of Onset  . Diabetes Other   . Cancer Other   . Hypertension Other     Social History   Tobacco Use  . Smoking status: Never Smoker  . Smokeless tobacco: Never Used  Vaping Use  . Vaping Use: Never used  Substance Use Topics  . Alcohol use: No  . Drug use: No    Home Medications Prior to Admission medications   Medication Sig Start Date End Date Taking? Authorizing Provider  cetirizine (ZYRTEC ALLERGY) 10 MG tablet Take 1 tablet (10 mg total) by mouth  daily. 02/29/20   Wallis Bamberg, PA-C  fluticasone (FLONASE) 50 MCG/ACT nasal spray Place 2 sprays into both nostrils daily. 02/29/20   Wallis Bamberg, PA-C  ibuprofen (ADVIL,MOTRIN) 800 MG tablet Take 1 tablet (800 mg total) by mouth 3 (three) times daily. 08/09/17   McDonald, Mia A, PA-C  metFORMIN (GLUCOPHAGE) 1000 MG tablet Take 1,000 mg by mouth 2 (two) times daily with a meal.    [provider]  naproxen (NAPROSYN) 500 MG tablet Take 1 tablet (500 mg total) by mouth 2 (two) times daily with a meal. 02/29/20   Wallis Bamberg, PA-C  pseudoephedrine (SUDAFED) 60 MG tablet Take 1 tablet (60 mg total) by mouth every 8 (eight) hours as needed for congestion. 02/29/20   Wallis Bamberg, PA-C  tiZANidine (ZANAFLEX) 4 MG tablet Take 1 tablet (4 mg total) by mouth every 6 (six) hours as needed for muscle spasms. 02/29/20   Wallis Bamberg, PA-C    Allergies    Patient has no known allergies.  Review of Systems   Review of Systems  Musculoskeletal: Negative for gait problem.  Neurological: Positive for facial asymmetry and headaches. Negative for weakness.  All other systems reviewed and are negative.   Physical Exam Updated Vital Signs BP (!) 140/98 (BP Location: Right Arm)   Pulse 72   Temp 98.5 F (36.9 C) (Oral)   Resp 18   Ht 5\' 9"  (1.753 m)  Wt 108.4 kg   SpO2 100%   BMI 35.29 kg/m   Physical Exam Vitals and nursing note reviewed.  HENT:     Nose: Nose normal.     Mouth/Throat:     Mouth: Mucous membranes are moist.  Eyes:     General: No visual field deficit.    Extraocular Movements: Extraocular movements intact.  Cardiovascular:     Rate and Rhythm: Normal rate.  Pulmonary:     Effort: Pulmonary effort is normal.  Abdominal:     Palpations: Abdomen is soft.  Musculoskeletal:        General: Normal range of motion.     Cervical back: Normal range of motion.  Skin:    General: Skin is warm and dry.  Neurological:     Mental Status: He is alert and oriented to person,  place, and time.     GCS: GCS eye subscore is 4. GCS verbal subscore is 5. GCS motor subscore is 6.     Cranial Nerves: Facial asymmetry present.     Sensory: Sensation is intact. No sensory deficit.     Motor: Motor function is intact. No weakness.     Coordination: Coordination is intact.     Gait: Gait is intact.  Psychiatric:        Mood and Affect: Mood normal.     ED Results / Procedures / Treatments   Labs (all labs ordered are listed, but only abnormal results are displayed) Labs Reviewed - No data to display  EKG None  Radiology CT Head Wo Contrast  Result Date: 04/23/2020 CLINICAL DATA:  Right-sided facial droop for 2 days, headache EXAM: CT HEAD WITHOUT CONTRAST TECHNIQUE: Contiguous axial images were obtained from the base of the skull through the vertex without intravenous contrast. COMPARISON:  04/26/2016 FINDINGS: Brain: No acute infarct or hemorrhage. Lateral ventricles and midline structures are unremarkable. No acute extra-axial fluid collections. No mass effect. Vascular: No hyperdense vessel or unexpected calcification. Skull: Normal. Negative for fracture or focal lesion. Sinuses/Orbits: No acute finding. Other: None. IMPRESSION: 1. No acute intracranial process. Electronically Signed   By: Sharlet Salina M.D.   On: 04/23/2020 20:09    Procedures Procedures   Medications Ordered in ED Medications - No data to display  ED Course  I have reviewed the triage vital signs and the nursing notes.  Pertinent labs & imaging results that were available during my care of the patient were reviewed by me and considered in my medical decision making (see chart for details).   Exam consistent with facial nerve palsy. Will obtain CT scan due to history of TIA. MDM Rules/Calculators/A&P                          Acute, painless, unilateral facial paralysis consistent with bell's palsy. CT of head without acute findings. Rx provided for ophthalmic ointment, prednisone, and  valtrex. Care instructions provided and return precautions discussed. Patient to follow-up with his primary care provider. Final Clinical Impression(s) / ED Diagnoses Final diagnoses:  Bell's palsy    Rx / DC Orders ED Discharge Orders         Ordered    predniSONE (DELTASONE) 20 MG tablet  Daily with breakfast        04/23/20 2055    valACYclovir (VALTREX) 1000 MG tablet  3 times daily        04/23/20 2055    artificial tears (LACRILUBE) OINT ophthalmic ointment  3 times daily        04/23/20 2055           Felicie Morn, NP 04/23/20 2323    Charlynne Pander, MD 05/01/20 1257

## 2022-03-22 ENCOUNTER — Emergency Department (HOSPITAL_BASED_OUTPATIENT_CLINIC_OR_DEPARTMENT_OTHER)
Admission: EM | Admit: 2022-03-22 | Discharge: 2022-03-22 | Disposition: A | Discharge status: Home / Self Care | Payer: BC Managed Care – PPO | Attending: Emergency Medicine | Admitting: Emergency Medicine

## 2022-03-22 ENCOUNTER — Other Ambulatory Visit: Payer: Self-pay

## 2022-03-22 ENCOUNTER — Encounter (HOSPITAL_BASED_OUTPATIENT_CLINIC_OR_DEPARTMENT_OTHER): Payer: Self-pay | Admitting: Emergency Medicine

## 2022-03-22 DIAGNOSIS — R5383 Other fatigue: Secondary | ICD-10-CM | POA: Diagnosis present

## 2022-03-22 DIAGNOSIS — B338 Other specified viral diseases: Secondary | ICD-10-CM | POA: Diagnosis not present

## 2022-03-22 DIAGNOSIS — Z1152 Encounter for screening for COVID-19: Secondary | ICD-10-CM | POA: Diagnosis not present

## 2022-03-22 LAB — RESP PANEL BY RT-PCR (RSV, FLU A&B, COVID)  RVPGX2
Influenza A by PCR: NEGATIVE
Influenza B by PCR: NEGATIVE
Resp Syncytial Virus by PCR: POSITIVE — AB
SARS Coronavirus 2 by RT PCR: NEGATIVE

## 2022-03-22 NOTE — ED Triage Notes (Signed)
Runny nose, congestion, voice changing, weakness since Friday.  No known fever.

## 2022-03-22 NOTE — ED Provider Notes (Signed)
[facility] EMERGENCY DEPARTMENT Provider Note   CSN: [id number] Arrival date & time: 03/22/22  4431     History  Chief Complaint  Patient presents with   URI    Dale Reed is a 47 y.o. male with history of diabetes, TIA 2018 who presents the emergency department complaining of nasal congestion, rhinorrhea, voice change, generalized fatigue for 3 days.  Does not believe that he had a fever.  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  He has been taking Tylenol cold and flu and Alka-Seltzer without relief.  Normal nonproductive cough, no shortness of breath   URI Presenting symptoms: congestion, cough, fatigue and rhinorrhea        Home Medications Prior to Admission medications   Medication Sig Start Date End Date Taking? Authorizing Provider  artificial tears (LACRILUBE) OINT ophthalmic ointment Place into the right eye 3 (three) times daily. 04/23/20   Felicie Morn, NP  cetirizine (ZYRTEC ALLERGY) 10 MG tablet Take 1 tablet (10 mg total) by mouth daily. 02/29/20   Wallis Bamberg, PA-C  fluticasone (FLONASE) 50 MCG/ACT nasal spray Place 2 sprays into both nostrils daily. 02/29/20   Wallis Bamberg, PA-C  ibuprofen (ADVIL,MOTRIN) 800 MG tablet Take 1 tablet (800 mg total) by mouth 3 (three) times daily. 08/09/17   McDonald, Mia A, PA-C  metFORMIN (GLUCOPHAGE) 1000 MG tablet Take 1,000 mg by mouth 2 (two) times daily with a meal.    [provider]  naproxen (NAPROSYN) 500 MG tablet Take 1 tablet (500 mg total) by mouth 2 (two) times daily with a meal. 02/29/20   Wallis Bamberg, PA-C  predniSONE (DELTASONE) 20 MG tablet Take 3 tablets (60 mg total) by mouth daily with breakfast. 04/23/20   Felicie Morn, NP  pseudoephedrine (SUDAFED) 60 MG tablet Take 1 tablet (60 mg total) by mouth every 8 (eight) hours as needed for congestion. 02/29/20   Wallis Bamberg, PA-C  tiZANidine (ZANAFLEX) 4 MG tablet Take 1 tablet (4 mg total) by mouth every 6 (six) hours as needed for  muscle spasms. 02/29/20   Wallis Bamberg, PA-C  valACYclovir (VALTREX) 1000 MG tablet Take 1 tablet (1,000 mg total) by mouth 3 (three) times daily. 04/23/20   Felicie Morn, NP      Allergies    Patient has no known allergies.    Review of Systems   Review of Systems  Constitutional:  Positive for fatigue.  HENT:  Positive for congestion, rhinorrhea and voice change.   Respiratory:  Positive for cough.   All other systems reviewed and are negative.   Physical Exam Updated Vital Signs BP (!) 124/91 (BP Location: Left Arm)   Pulse (!) 107   Temp 98.1 F (36.7 C) (Oral)   Resp 18   Ht 5\' 10"  (1.778 m)   Wt 108.9 kg   SpO2 99%   BMI 34.44 kg/m  Physical Exam Vitals and nursing note reviewed.  Constitutional:      Appearance: Normal appearance.  HENT:     Head: Normocephalic and atraumatic.     Right Ear: Ear canal and external ear normal. Tympanic membrane is erythematous.     Left Ear: Ear canal and external ear normal. Tympanic membrane is erythematous.  Eyes:     Conjunctiva/sclera: Conjunctivae normal.  Cardiovascular:     Rate and Rhythm: Normal rate and regular rhythm.  Pulmonary:     Effort: Pulmonary effort is normal. No respiratory distress.     Breath sounds: Normal breath  sounds.  Abdominal:     General: There is no distension.     Palpations: Abdomen is soft.     Tenderness: There is no abdominal tenderness.  Skin:    General: Skin is warm and dry.  Neurological:     General: No focal deficit present.     Mental Status: He is alert.     ED Results / Procedures / Treatments   Labs (all labs ordered are listed, but only abnormal results are displayed) Labs Reviewed  RESP PANEL BY RT-PCR (RSV, FLU A&B, COVID)  RVPGX2 - Abnormal; Notable for the following components:      Result Value   Resp Syncytial Virus by PCR POSITIVE (*)    All other components within normal limits    EKG None  Radiology No results found.  Procedures Procedures     Medications Ordered in ED Medications - No data to display  ED Course/ Medical Decision Making/ A&P                           Medical Decision Making  This patient is a 47 y.o. male who presents to the ED for concern of rhinorrhea, nasal congestion, generalized fatigue x 3 days.   Differential diagnoses prior to evaluation: The emergent differential diagnosis includes, but is not limited to,  upper respiratory infection, lower respiratory infection, allergies, asthma, irritants, sinus/esophageal foreign body, medications, reflux, interstitial lung disease, postnasal drip, viral illness, sepsis. This is not an exhaustive differential.   Past Medical History / Co-morbidities / Additional history: Chart reviewed. Pertinent results include: diabetes, TIA 2018  Physical Exam: Physical exam performed. The pertinent findings include: Patient mildly tachycardic, afebrile.  Normal oxygen saturation, no increased respiratory effort.  Lung sounds clear. Bilateral TM's are erythematous, no bulging, suspect due to increased sinus pressure. Overall appears clinically well.   Lab Tests/Imaging studies: I personally interpreted labs/imaging and the pertinent results include:  respiratory panel positive for RSV.   Disposition: After consideration of the diagnostic results and the patients response to treatment, I feel that emergency department workup does not suggest an emergent condition requiring admission or immediate intervention beyond what has been performed at this time. Patient with symptoms consistent with RSV illness.  Vitals are stable, low-grade fever.  No signs of dehydration, tolerating PO's.  Lungs are clear.   The plan is: Patient will be discharged with instructions to orally hydrate, rest, and use over-the-counter medications such as anti-inflammatories such as ibuprofen and Tylenol for fever, and decongestants as needed. The patient is safe for discharge and has been instructed to  return immediately for worsening symptoms, change in symptoms or any other concerns.  Final Clinical Impression(s) / ED Diagnoses Final diagnoses:  RSV (respiratory syncytial virus infection)    Rx / DC Orders ED Discharge Orders     None      Portions of this report may have been transcribed using voice recognition software. Every effort was made to ensure accuracy; however, inadvertent computerized transcription errors may be present.    Estill Cotta 03/22/22 1019    Regan Lemming, MD 03/22/22 1751

## 2022-03-22 NOTE — Discharge Instructions (Signed)
You were seen in the emergency department today for nasal congestion and runny nose.  As we discussed your RSV test is positive.  This is a viral illness common at this time of year, and we normally treat with over-the-counter medications.  Symptoms can last for up to a week.  You can take ibuprofen or Tylenol for pain or fever, and I recommend alternating between the 2.  Make sure that you are drinking lots of fluids and getting plenty of rest. You can take decongestants as long as you take them with lots of water. You can use lozenges or chloraseptic spray as needed for sore throat.   You can use over the counter decongestant pills or nasal sprays. Some medications include Mucinex, oxymetazoline, phenylephrine. Please follow the package instructions for how to use these.   Please use Tylenol or ibuprofen for pain.  You may use 600 mg ibuprofen every 6 hours or 1000 mg of Tylenol every 6 hours.  You may choose to alternate between the 2.  This would be most effective.  Do not exceed 4 g of Tylenol within 24 hours.  Do not exceed 3200 mg ibuprofen within 24 hours.  Continue to monitor how you are doing, and return to the emergency department for new or worsening symptoms such as chest pain, difficulty breathing not related to coughing, fever despite medication, or persistent vomiting or diarrhea.  It has been a pleasure taking care of you today and I hope you begin to feel better soon!

## 2022-09-22 ENCOUNTER — Encounter (HOSPITAL_BASED_OUTPATIENT_CLINIC_OR_DEPARTMENT_OTHER): Payer: Self-pay

## 2022-09-22 ENCOUNTER — Other Ambulatory Visit: Payer: Self-pay

## 2022-09-22 ENCOUNTER — Emergency Department (HOSPITAL_BASED_OUTPATIENT_CLINIC_OR_DEPARTMENT_OTHER)
Admission: EM | Admit: 2022-09-22 | Discharge: 2022-09-22 | Disposition: A | Discharge status: Home / Self Care | Payer: BC Managed Care – PPO | Attending: Emergency Medicine | Admitting: Emergency Medicine

## 2022-09-22 ENCOUNTER — Emergency Department (HOSPITAL_BASED_OUTPATIENT_CLINIC_OR_DEPARTMENT_OTHER): Payer: BC Managed Care – PPO

## 2022-09-22 DIAGNOSIS — Z79899 Other long term (current) drug therapy: Secondary | ICD-10-CM | POA: Diagnosis not present

## 2022-09-22 DIAGNOSIS — M5126 Other intervertebral disc displacement, lumbar region: Secondary | ICD-10-CM | POA: Diagnosis not present

## 2022-09-22 DIAGNOSIS — I70202 Unspecified atherosclerosis of native arteries of extremities, left leg: Secondary | ICD-10-CM

## 2022-09-22 DIAGNOSIS — M5136 Other intervertebral disc degeneration, lumbar region: Secondary | ICD-10-CM

## 2022-09-22 DIAGNOSIS — R209 Unspecified disturbances of skin sensation: Secondary | ICD-10-CM | POA: Insufficient documentation

## 2022-09-22 DIAGNOSIS — M545 Low back pain, unspecified: Secondary | ICD-10-CM | POA: Diagnosis present

## 2022-09-22 LAB — CBC WITH DIFFERENTIAL/PLATELET
Abs Immature Granulocytes: 0.03 10*3/uL (ref 0.00–0.07)
Basophils Absolute: 0.1 10*3/uL (ref 0.0–0.1)
Basophils Relative: 1 %
Eosinophils Absolute: 0.1 10*3/uL (ref 0.0–0.5)
Eosinophils Relative: 1 %
HCT: 44 % (ref 39.0–52.0)
Hemoglobin: 16.2 g/dL (ref 13.0–17.0)
Immature Granulocytes: 0 %
Lymphocytes Relative: 24 %
Lymphs Abs: 2 10*3/uL (ref 0.7–4.0)
MCH: 30.1 pg (ref 26.0–34.0)
MCHC: 36.8 g/dL — ABNORMAL HIGH (ref 30.0–36.0)
MCV: 81.6 fL (ref 80.0–100.0)
Monocytes Absolute: 0.5 10*3/uL (ref 0.1–1.0)
Monocytes Relative: 6 %
Neutro Abs: 5.6 10*3/uL (ref 1.7–7.7)
Neutrophils Relative %: 68 %
Platelets: 304 10*3/uL (ref 150–400)
RBC: 5.39 MIL/uL (ref 4.22–5.81)
RDW: 12.1 % (ref 11.5–15.5)
WBC: 8.3 10*3/uL (ref 4.0–10.5)
nRBC: 0 % (ref 0.0–0.2)

## 2022-09-22 LAB — COMPREHENSIVE METABOLIC PANEL
ALT: 23 U/L (ref 0–44)
AST: 17 U/L (ref 15–41)
Albumin: 4.9 g/dL (ref 3.5–5.0)
Alkaline Phosphatase: 103 U/L (ref 38–126)
Anion gap: 10 (ref 5–15)
BUN: 15 mg/dL (ref 6–20)
CO2: 23 mmol/L (ref 22–32)
Calcium: 9.8 mg/dL (ref 8.9–10.3)
Chloride: 103 mmol/L (ref 98–111)
Creatinine, Ser: 0.73 mg/dL (ref 0.61–1.24)
GFR, Estimated: >60 mL/min (ref >60)
Glucose, Bld: 290 mg/dL — ABNORMAL HIGH (ref 70–99)
Potassium: 4.1 mmol/L (ref 3.5–5.1)
Sodium: 136 mmol/L (ref 135–145)
Total Bilirubin: 0.6 mg/dL (ref 0.3–1.2)
Total Protein: 7.5 g/dL (ref 6.5–8.1)

## 2022-09-22 LAB — CBG MONITORING, ED: Glucose-Capillary: 278 mg/dL — ABNORMAL HIGH (ref 70–99)

## 2022-09-22 MED ORDER — METHOCARBAMOL 500 MG PO TABS
1000.0000 mg | ORAL_TABLET | Freq: Once | ORAL | Status: AC
  Administered 2022-09-22: 1000 mg via ORAL
  Filled 2022-09-22: qty 2

## 2022-09-22 MED ORDER — MORPHINE SULFATE (PF) 4 MG/ML IV SOLN
4.0000 mg | Freq: Once | INTRAVENOUS | Status: AC
  Administered 2022-09-22: 4 mg via INTRAVENOUS
  Filled 2022-09-22: qty 1

## 2022-09-22 MED ORDER — HEPARIN BOLUS VIA INFUSION: 6000.0000 [IU] | Freq: Once | INTRAVENOUS | Status: DC

## 2022-09-22 MED ORDER — IOHEXOL 350 MG/ML SOLN
125.0000 mL | Freq: Once | INTRAVENOUS | Status: AC | PRN
  Administered 2022-09-22: 125 mL via INTRAVENOUS

## 2022-09-22 MED ORDER — SODIUM CHLORIDE 0.9 % IV BOLUS
1000.0000 mL | Freq: Once | INTRAVENOUS | Status: AC
  Administered 2022-09-22: 1000 mL via INTRAVENOUS

## 2022-09-22 MED ORDER — HEPARIN (PORCINE) 25000 UT/250ML-% IV SOLN
1700.0000 [IU]/h | INTRAVENOUS | Status: DC
  Filled 2022-09-22: qty 250

## 2022-09-22 MED ORDER — OXYCODONE-ACETAMINOPHEN 5-325 MG PO TABS
2.0000 | ORAL_TABLET | Freq: Once | ORAL | Status: AC
  Administered 2022-09-22: 2 via ORAL
  Filled 2022-09-22: qty 2

## 2022-09-22 NOTE — ED Triage Notes (Signed)
C/o lumbar back pain x 1 month, worsening and migrating up back. Denies recent injury however lifts heavy objects. Hx of back pain. States tingling to left leg.

## 2022-09-22 NOTE — Progress Notes (Signed)
i-STAT Chem8 run on patient sample.  Results are not crossing over into Epic.  Results have been printed and provided to Dr. Cheri Rous, MD and are as follows.  Na = 137 mmo I/L K = 5.6 mmo I/L Cl = 105 mmo I/L iCA = 1.12 mm0 I/L  Glu = 293 mg/dL BUN = 19 mg/dL Crea = 0.6 mg/dL Hct = 44 %PCV Hb = 16.1 g/dL AnGap = 16 mmo I/L

## 2022-09-22 NOTE — ED Provider Notes (Addendum)
[place] [facility] Provider Note   CSN: [id number] Arrival date & time: 09/22/22  1819     History  Chief Complaint  Patient presents with   Back Pain    Dale Reed is a 47 y.o. male here presenting with back pain and cold leg.  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  He states that he has been having lower back pain for about a month.  He went to chiropractor and had x-rays that showed possible disc protrusion.  Patient states that the pain progressively got worse and today he noticed that his left lower leg is cold and numb.  He denies any incontinence.  He denies any history of claudication.  The history is provided by the patient.       Home Medications Prior to Admission medications   Medication Sig Start Date End Date Taking? Authorizing Provider  artificial tears (LACRILUBE) OINT ophthalmic ointment Place into the right eye 3 (three) times daily. 04/23/20   Felicie Morn, NP  cetirizine (ZYRTEC ALLERGY) 10 MG tablet Take 1 tablet (10 mg total) by mouth daily. 02/29/20   Wallis Bamberg, PA-C  fluticasone (FLONASE) 50 MCG/ACT nasal spray Place 2 sprays into both nostrils daily. 02/29/20   Wallis Bamberg, PA-C  ibuprofen (ADVIL,MOTRIN) 800 MG tablet Take 1 tablet (800 mg total) by mouth 3 (three) times daily. 08/09/17   McDonald, Mia A, PA-C  metFORMIN (GLUCOPHAGE) 1000 MG tablet Take 1,000 mg by mouth 2 (two) times daily with a meal.    [provider]  naproxen (NAPROSYN) 500 MG tablet Take 1 tablet (500 mg total) by mouth 2 (two) times daily with a meal. 02/29/20   Wallis Bamberg, PA-C  predniSONE (DELTASONE) 20 MG tablet Take 3 tablets (60 mg total) by mouth daily with breakfast. 04/23/20   Felicie Morn, NP  pseudoephedrine (SUDAFED) 60 MG tablet Take 1 tablet (60 mg total) by mouth every 8 (eight) hours as needed for congestion. 02/29/20   Wallis Bamberg, PA-C  tiZANidine (ZANAFLEX) 4 MG tablet Take 1 tablet (4 mg total) by  mouth every 6 (six) hours as needed for muscle spasms. 02/29/20   Wallis Bamberg, PA-C  valACYclovir (VALTREX) 1000 MG tablet Take 1 tablet (1,000 mg total) by mouth 3 (three) times daily. 04/23/20   Felicie Morn, NP      Allergies    Patient has no known allergies.    Review of Systems   Review of Systems  Musculoskeletal:  Positive for back pain.  All other systems reviewed and are negative.   Physical Exam Updated Vital Signs BP (!) 136/95 (BP Location: Left Arm)   Pulse 100   Temp 98.4 F (36.9 C) (Oral)   Resp 18   Ht 5\' 10"  (1.778 m)   Wt 106.1 kg   SpO2 100%   BMI 33.58 kg/m  Physical Exam Vitals and nursing note reviewed.  Constitutional:      Appearance: Normal appearance.     Comments: Uncomfortable and sitting up straight  HENT:     Head: Normocephalic.     Nose: Nose normal.     Mouth/Throat:     Mouth: Mucous membranes are moist.  Eyes:     Extraocular Movements: Extraocular movements intact.     Pupils: Pupils are equal, round, and reactive to light.  Cardiovascular:     Rate and Rhythm: Normal rate and regular rhythm.     Pulses: Normal pulses.  Heart sounds: Normal heart sounds.  Pulmonary:     Effort: Pulmonary effort is normal.     Breath sounds: Normal breath sounds.  Abdominal:     General: Abdomen is flat.     Palpations: Abdomen is soft.  Musculoskeletal:     Cervical back: Normal range of motion and neck supple.     Comments: Lower lumbar tenderness.  Positive straight leg raise of the left leg.  Patient has good palpable left popliteal pulse.  However distal to that the calf and leg appears very cold.  I was unable to palpate a left DP pulse.  He does have a dopplerable left PT pulse.  Patient's right lower extremity has normal neurovascular exam.  Patient has no saddle anesthesia.  Skin:    General: Skin is warm.     Capillary Refill: Capillary refill takes less than 2 seconds.  Neurological:     General: No focal deficit present.      Mental Status: He is alert and oriented to person, place, and time.     Comments: No obvious saddle anesthesia  Psychiatric:        Mood and Affect: Mood normal.        Behavior: Behavior normal.     ED Results / Procedures / Treatments   Labs (all labs ordered are listed, but only abnormal results are displayed) Labs Reviewed  CBC WITH DIFFERENTIAL/PLATELET  COMPREHENSIVE METABOLIC PANEL  CBG MONITORING, ED  I-STAT CHEM 8, ED    EKG None  Radiology No results found.  Procedures Procedures    Medications Ordered in ED Medications  morphine (PF) 4 MG/ML injection 4 mg (has no administration in time range)  sodium chloride 0.9 % bolus 1,000 mL (has no administration in time range)    ED Course/ Medical Decision Making/ A&P                             Medical Decision Making Dale Reed is a 47 y.o. male here presenting with back pain and cold left leg.  Patient has faint dopplerable pulse in the left PT area and no dopplerable pulse in the left DP.  Concern for possible arterial clot versus claudication.  Also consider lumbar radiculopathy causing his back pain.  Plan to get CTA aortobifemoral with run off to bilateral lower extremities.  Will get recon of the lumbar spine.  Will hold off on MRI currently.  Will give pain meds and reassess  11:10 PM Reviewed patient's labs and independently interpreted imaging study.  Patient has absent flow of the distal left anterior tibial artery concern for possible thrombosis.  I discussed case with Dr. Juanetta Gosling from vascular surgery.  He reviewed the images and felt that the anterior tibial artery occlusion is very small and he has a patent posterior tibial artery so he would have blood flow to the leg. He does not recommend any anticoagulation but just baby aspirin.  Of note, patient does have lumbar disc disease.  Patient in particular has L4-5 disc protrusion causing nerve root compression.  Since patient has no saddle anesthesia and  pain is under control, I do not think he needs an emergent MRI.  However he will need to follow-up with neurosurgery and also vascular surgery.  Patient is already on pain medicine at home and has a pain management doctor.  I told him that he needs to call his pain management doctor in the morning.Marland Kitchen  Updated  patient and friend at bedside.  Problems Addressed: L4-L5 disc bulge: acute illness or injury Tibial artery occlusion, left [facility]): acute illness or injury  Amount and/or Complexity of Data Reviewed Labs: ordered. Decision-making details documented in ED Course. Radiology: ordered and independent interpretation performed. Decision-making details documented in ED Course.  Risk Prescription drug management.    Final Clinical Impression(s) / ED Diagnoses Final diagnoses:  None    Rx / DC Orders ED Discharge Orders     None         Charlynne Pander, MD 09/22/22 2325    Charlynne Pander, MD 09/22/22 [phone]

## 2022-09-22 NOTE — Discharge Instructions (Signed)
As we discussed you got occlusion of the left anterior tibial artery.  I recommend taking aspirin 81 mg daily.  I discussed the case with Dr. Juanetta Gosling from vascular surgery and I have included his number and you should give him a call for follow-up  Also as we discussed, you have disc bulge and L4-5 that is likely causing your back pain.  Please call Dr. Lovell Sheehan office to get follow-up.  He is a Midwife  You are under pain management so you need to call your doctor tomorrow and update your doctor regarding your condition.  You may need additional doses of pain medicine or muscle relaxant but you need to call your doctor for a prescription  Return to ER if you have worse back pain or leg pain or leg turning cold.

## 2022-09-22 NOTE — ED Notes (Signed)
CT made aware pt can go to CT, [employer]

## 2022-09-22 NOTE — Progress Notes (Addendum)
ANTICOAGULATION CONSULT NOTE - Initial Consult  Pharmacy Consult for Heparin Indication:  L tibial artery occlusion  No Known Allergies  Patient Measurements: Height: 5\' 10"  (177.8 cm) Weight: 106.1 kg (234 lb) IBW/kg (Calculated) : 73 Heparin Dosing Weight: 95.7 kg  Vital Signs: Temp: 98.4 F (36.9 C) (07/10 1840) Temp Source: Oral (07/10 1840) BP: 117/87 (07/10 2200) Pulse Rate: 96 (07/10 2200)  Labs: Recent Labs    09/22/22 2020  HGB 16.2  HCT 44.0  PLT 304  CREATININE 0.73    Estimated Creatinine Clearance: 137.7 mL/min (by C-G formula based on SCr of 0.73 mg/dL).   Medical History: Past Medical History:  Diagnosis Date   Back pain    Chest pain    Diabetes mellitus    SOB (shortness of breath)    TIA (transient ischemic attack) 04/26/2016    Medications:  (Not in a hospital admission)  Scheduled:  Infusions:  PRN:   Assessment: 47 yom with a history of diabetes, TIA 2018 . Patient is presenting with back pain and cold leg . Heparin per pharmacy consult placed for  L tibial artery occlusion .  Patient is not on anticoagulation prior to arrival.  Hgb 16.2; plt 304  Goal of Therapy:  Heparin level 0.3-0.7 units/ml Monitor platelets by anticoagulation protocol: Yes   Plan:  Give IV heparin 6000 units bolus x 1 Start heparin infusion at 1700 units/hr Check anti-Xa level in 6 hours and daily while on heparin Continue to monitor H&H and platelets  Delmar Landau, PharmD, BCPS 09/22/2022 11:06 PM ED Clinical Pharmacist -  [phone]  ADDENDUM: Heparin consult discontinued by EDP prior to heparin start Associated labs and monitoring have been stopped per protocol.  Delmar Landau, PharmD, BCPS 09/22/2022 11:23 PM ED Clinical Pharmacist -  [phone]

## 2022-09-22 NOTE — ED Notes (Signed)
Called to administer more pain meds to pt in CT Pt was unable to lie flat due to pain on left side

## 2022-09-23 ENCOUNTER — Encounter (HOSPITAL_BASED_OUTPATIENT_CLINIC_OR_DEPARTMENT_OTHER): Payer: Self-pay

## 2022-09-23 ENCOUNTER — Emergency Department (HOSPITAL_BASED_OUTPATIENT_CLINIC_OR_DEPARTMENT_OTHER)
Admission: EM | Admit: 2022-09-23 | Discharge: 2022-09-23 | Disposition: A | Discharge status: Home / Self Care | Payer: BC Managed Care – PPO | Attending: Emergency Medicine | Admitting: Emergency Medicine

## 2022-09-23 ENCOUNTER — Other Ambulatory Visit: Payer: Self-pay

## 2022-09-23 DIAGNOSIS — M79605 Pain in left leg: Secondary | ICD-10-CM | POA: Diagnosis present

## 2022-09-23 DIAGNOSIS — M545 Low back pain, unspecified: Secondary | ICD-10-CM | POA: Insufficient documentation

## 2022-09-23 LAB — I-STAT CHEM 8, ED
BUN: 19 mg/dL (ref 6–20)
Calcium, Ion: 1.12 mmol/L — ABNORMAL LOW (ref 1.15–1.40)
Chloride: 105 mmol/L (ref 98–111)
Creatinine, Ser: 0.6 mg/dL — ABNORMAL LOW (ref 0.61–1.24)
Glucose, Bld: 293 mg/dL — ABNORMAL HIGH (ref 70–99)
HCT: 44 % (ref 39.0–52.0)
Hemoglobin: 15 g/dL (ref 13.0–17.0)
Potassium: 5.6 mmol/L — ABNORMAL HIGH (ref 3.5–5.1)
Sodium: 137 mmol/L (ref 135–145)
TCO2: 23 mmol/L (ref 22–32)

## 2022-09-23 MED ORDER — HYDROMORPHONE HCL 1 MG/ML IJ SOLN
2.0000 mg | Freq: Once | INTRAMUSCULAR | Status: AC
  Administered 2022-09-23: 2 mg via INTRAMUSCULAR
  Filled 2022-09-23: qty 2

## 2022-09-23 NOTE — ED Provider Notes (Signed)
[place] [facility]  Provider Note  CSN: [id number] Arrival date & time: 09/23/22 0408  History Chief Complaint  Patient presents with   Leg Pain    Dale Reed is a 47 y.o. male with history of chronic back pain (sees Pain Management, gets Percocet 10/325 x 180/month) seen in the ED earlier tonight for back pain and cold left foot. He had a CTA and was found to have a small distal clot in L anterior tibial artery but good collaterals. Vascular surgery recommended ASA only. He was also noted to have an L4-5 disc bulge which may have also been contributing to his leg pain. He was given parenteral and oral pain medications while in the ED and was well controlled at discharge. He returns for continued pain in his L leg and lower back. He did not take any of his home pain medication when he began having pain again a few hours ago. No new symptoms.    Home Medications Prior to Admission medications   Medication Sig Start Date End Date Taking? Authorizing Provider  artificial tears (LACRILUBE) OINT ophthalmic ointment Place into the right eye 3 (three) times daily. 04/23/20   Felicie Morn, NP  cetirizine (ZYRTEC ALLERGY) 10 MG tablet Take 1 tablet (10 mg total) by mouth daily. 02/29/20   Wallis Bamberg, PA-C  fluticasone (FLONASE) 50 MCG/ACT nasal spray Place 2 sprays into both nostrils daily. 02/29/20   Wallis Bamberg, PA-C  ibuprofen (ADVIL,MOTRIN) 800 MG tablet Take 1 tablet (800 mg total) by mouth 3 (three) times daily. 08/09/17   McDonald, Mia A, PA-C  metFORMIN (GLUCOPHAGE) 1000 MG tablet Take 1,000 mg by mouth 2 (two) times daily with a meal.    [provider]  naproxen (NAPROSYN) 500 MG tablet Take 1 tablet (500 mg total) by mouth 2 (two) times daily with a meal. 02/29/20   Wallis Bamberg, PA-C  predniSONE (DELTASONE) 20 MG tablet Take 3 tablets (60 mg total) by mouth daily with breakfast. 04/23/20   Felicie Morn, NP  pseudoephedrine (SUDAFED) 60 MG  tablet Take 1 tablet (60 mg total) by mouth every 8 (eight) hours as needed for congestion. 02/29/20   Wallis Bamberg, PA-C  tiZANidine (ZANAFLEX) 4 MG tablet Take 1 tablet (4 mg total) by mouth every 6 (six) hours as needed for muscle spasms. 02/29/20   Wallis Bamberg, PA-C  valACYclovir (VALTREX) 1000 MG tablet Take 1 tablet (1,000 mg total) by mouth 3 (three) times daily. 04/23/20   Felicie Morn, NP     Allergies    Patient has no known allergies.   Review of Systems   Review of Systems Please see HPI for pertinent positives and negatives  Physical Exam BP (!) 132/92   Pulse (!) 107   Temp 98.7 F (37.1 C) (Oral)   Ht 5\' 10"  (1.778 m)   Wt 106.1 kg   SpO2 98%   BMI 33.56 kg/m   Physical Exam Vitals and nursing note reviewed.  Constitutional:      Appearance: Normal appearance.  HENT:     Head: Normocephalic and atraumatic.     Nose: Nose normal.     Mouth/Throat:     Mouth: Mucous membranes are moist.  Eyes:     Extraocular Movements: Extraocular movements intact.     Conjunctiva/sclera: Conjunctivae normal.  Cardiovascular:     Rate and Rhythm: Normal rate.  Pulmonary:     Effort: Pulmonary effort is normal.     Breath  sounds: Normal breath sounds.  Abdominal:     General: Abdomen is flat.     Palpations: Abdomen is soft.     Tenderness: There is no abdominal tenderness.  Musculoskeletal:        General: No swelling. Normal range of motion.     Cervical back: Neck supple.     Comments: L foot is not cold compared to R, there is a palpable DP pulse, good cap refill  Skin:    General: Skin is warm and dry.  Neurological:     General: No focal deficit present.     Mental Status: He is alert.  Psychiatric:        Mood and Affect: Mood normal.     ED Results / Procedures / Treatments   EKG None  Procedures Procedures  Medications Ordered in the ED Medications  HYDROmorphone (DILAUDID) injection 2 mg (has no administration in time range)    Initial  Impression and Plan  Patient here with continued Left leg pain, doubt this is from the small clot found on CTA given collateral flow and intact pulses now. More likely this is from lumbar disk bulging. He does not have red flags for cauda equina syndrome. Unclear why he didn't take his pain medications at home other than he expresses a concern for running out early. I offered him additional IM pain medication here but as with his previous visit, recommend outpatient management with his Pain clinic and with Neuro/Spine. He has also been referred to Vascular.   ED Course       MDM Rules/Calculators/A&P Medical Decision Making Problems Addressed: Left leg pain: acute illness or injury  Risk Prescription drug management. Parenteral controlled substances.     Final Clinical Impression(s) / ED Diagnoses Final diagnoses:  Left leg pain    Rx / DC Orders ED Discharge Orders     None        Pollyann Savoy, MD 09/23/22 0501

## 2022-09-23 NOTE — ED Triage Notes (Signed)
Pt was seen earlier today and dx with DVT in left leg. Pt went home and has had increasing pain. Pt is unable to lay down of move leg without severe pain.

## 2022-09-23 NOTE — ED Notes (Signed)
ED Provider at bedside. 

## 2022-09-26 ENCOUNTER — Emergency Department (HOSPITAL_BASED_OUTPATIENT_CLINIC_OR_DEPARTMENT_OTHER)
Admission: EM | Admit: 2022-09-26 | Discharge: 2022-09-27 | Disposition: A | Discharge status: Home / Self Care | Payer: BC Managed Care – PPO | Source: Home / Self Care | Attending: Emergency Medicine | Admitting: Emergency Medicine

## 2022-09-26 ENCOUNTER — Other Ambulatory Visit: Payer: Self-pay

## 2022-09-26 ENCOUNTER — Encounter (HOSPITAL_BASED_OUTPATIENT_CLINIC_OR_DEPARTMENT_OTHER): Payer: Self-pay

## 2022-09-26 DIAGNOSIS — G8929 Other chronic pain: Secondary | ICD-10-CM | POA: Diagnosis not present

## 2022-09-26 DIAGNOSIS — M549 Dorsalgia, unspecified: Secondary | ICD-10-CM | POA: Insufficient documentation

## 2022-09-26 DIAGNOSIS — Z7984 Long term (current) use of oral hypoglycemic drugs: Secondary | ICD-10-CM | POA: Diagnosis not present

## 2022-09-26 DIAGNOSIS — M545 Low back pain, unspecified: Secondary | ICD-10-CM

## 2022-09-26 MED ORDER — KETOROLAC TROMETHAMINE 60 MG/2ML IM SOLN
60.0000 mg | Freq: Once | INTRAMUSCULAR | Status: AC
  Administered 2022-09-26: 60 mg via INTRAMUSCULAR
  Filled 2022-09-26: qty 2

## 2022-09-26 MED ORDER — HYDROMORPHONE HCL 1 MG/ML IJ SOLN
2.0000 mg | Freq: Once | INTRAMUSCULAR | Status: AC
  Administered 2022-09-26: 2 mg via INTRAMUSCULAR
  Filled 2022-09-26: qty 2

## 2022-09-26 NOTE — ED Triage Notes (Signed)
Pt arrives with c/o bilateral ankle pain and swelling that started last week. Pt recently diagnosed with DVT in left leg. Pt does not take blood thinners. Pt denies CP or SOB.

## 2022-09-26 NOTE — ED Provider Notes (Signed)
[place] [facility] Provider Note   CSN: [id number] Arrival date & time: 09/26/22  2051     History  Chief Complaint  Patient presents with   Joint Swelling    Dale Reed is a 47 y.o. male.  Patient is a 47 year old male with past medical history of chronic back pain.  Patient receives oxycodone from his primary doctor.  He recently switched doctors and has since run out of his pain medication.  He states he is having unbearable pain in his back radiating down his left leg.  He is unable to walk or move secondary to the discomfort.  He denies any new injury or trauma.  He denies any bowel or bladder complaints.  Patient was here several days ago and underwent a CTA of the abdomen with bifemoral runoff.  This apparently showed a tiny clot in one of the arteries of his leg.  It was discussed with vascular surgery and nothing more than a baby aspirin was recommended.  The history is provided by the patient.       Home Medications Prior to Admission medications   Medication Sig Start Date End Date Taking? Authorizing Provider  artificial tears (LACRILUBE) OINT ophthalmic ointment Place into the right eye 3 (three) times daily. 04/23/20   Felicie Morn, NP  cetirizine (ZYRTEC ALLERGY) 10 MG tablet Take 1 tablet (10 mg total) by mouth daily. 02/29/20   Wallis Bamberg, PA-C  fluticasone (FLONASE) 50 MCG/ACT nasal spray Place 2 sprays into both nostrils daily. 02/29/20   Wallis Bamberg, PA-C  ibuprofen (ADVIL,MOTRIN) 800 MG tablet Take 1 tablet (800 mg total) by mouth 3 (three) times daily. 08/09/17   McDonald, Mia A, PA-C  metFORMIN (GLUCOPHAGE) 1000 MG tablet Take 1,000 mg by mouth 2 (two) times daily with a meal.    [provider]  naproxen (NAPROSYN) 500 MG tablet Take 1 tablet (500 mg total) by mouth 2 (two) times daily with a meal. 02/29/20   Wallis Bamberg, PA-C  predniSONE (DELTASONE) 20 MG tablet Take 3 tablets (60 mg total) by mouth daily with  breakfast. 04/23/20   Felicie Morn, NP  pseudoephedrine (SUDAFED) 60 MG tablet Take 1 tablet (60 mg total) by mouth every 8 (eight) hours as needed for congestion. 02/29/20   Wallis Bamberg, PA-C  tiZANidine (ZANAFLEX) 4 MG tablet Take 1 tablet (4 mg total) by mouth every 6 (six) hours as needed for muscle spasms. 02/29/20   Wallis Bamberg, PA-C  valACYclovir (VALTREX) 1000 MG tablet Take 1 tablet (1,000 mg total) by mouth 3 (three) times daily. 04/23/20   Felicie Morn, NP      Allergies    Patient has no known allergies.    Review of Systems   Review of Systems  All other systems reviewed and are negative.   Physical Exam Updated Vital Signs BP (!) 150/87 (BP Location: Left Arm)   Pulse (!) 120   Temp 98.6 F (37 C) (Oral)   Resp (!) 22   Ht 5\' 10"  (1.778 m)   Wt 105.7 kg   SpO2 99%   BMI 33.43 kg/m  Physical Exam Vitals and nursing note reviewed.  Constitutional:      Appearance: Normal appearance.  HENT:     Head: Normocephalic.  Pulmonary:     Effort: Pulmonary effort is normal.  Musculoskeletal:     Comments: The lower extremity is grossly normal in appearance.  It is not cool to the touch.  DP pulses  are palpable to the left foot.  Skin:    General: Skin is warm and dry.  Neurological:     Mental Status: He is alert and oriented to person, place, and time.     Comments: DTRs are 1+ and symmetrical in the patellar and Achilles tendons bilaterally.  Strength is 5 out of 5 in both lower extremities.     ED Results / Procedures / Treatments   Labs (all labs ordered are listed, but only abnormal results are displayed) Labs Reviewed - No data to display  EKG None  Radiology No results found.  Procedures Procedures    Medications Ordered in ED Medications  HYDROmorphone (DILAUDID) injection 2 mg (has no administration in time range)    ED Course/ Medical Decision Making/ A&P  Patient is a 47 year old male with history of chronic back pain on chronic opioids.   Patient presenting today with severe pain in his leg and back.  He apparently changed doctors and has run out of his oxycodone.  This is his third visit in 4 days with similar complaints.  He was previously found to have a small clot in one of the arteries in his leg that vascular did not feel was a candidate for intervention and was started on aspirin.  Patient given IM Dilaudid and seems to be feeling better.  I have advised him to follow-up with his primary doctor or new doctor to discuss his pain medications.  Final Clinical Impression(s) / ED Diagnoses Final diagnoses:  None    Rx / DC Orders ED Discharge Orders     None         Geoffery Lyons, MD 09/27/22 0045

## 2022-09-26 NOTE — ED Notes (Signed)
Patient states he can't 'get my legs up and lie on the bed'.  Patient's visitor is sitting at bedside after patient got his pain meds. Call bell is in reach.

## 2022-09-27 NOTE — Discharge Instructions (Signed)
Follow-up with your primary doctor in the morning to discuss your medications.

## 2022-10-16 ENCOUNTER — Emergency Department (HOSPITAL_BASED_OUTPATIENT_CLINIC_OR_DEPARTMENT_OTHER)
Admission: EM | Admit: 2022-10-16 | Discharge: 2022-10-17 | Disposition: A | Discharge status: Home / Self Care | Payer: BC Managed Care – PPO | Source: Home / Self Care | Attending: Emergency Medicine | Admitting: Emergency Medicine

## 2022-10-16 ENCOUNTER — Other Ambulatory Visit: Payer: Self-pay

## 2022-10-16 ENCOUNTER — Encounter (HOSPITAL_BASED_OUTPATIENT_CLINIC_OR_DEPARTMENT_OTHER): Payer: Self-pay

## 2022-10-16 DIAGNOSIS — Z7984 Long term (current) use of oral hypoglycemic drugs: Secondary | ICD-10-CM | POA: Diagnosis not present

## 2022-10-16 DIAGNOSIS — E1165 Type 2 diabetes mellitus with hyperglycemia: Secondary | ICD-10-CM | POA: Insufficient documentation

## 2022-10-16 DIAGNOSIS — R112 Nausea with vomiting, unspecified: Secondary | ICD-10-CM | POA: Diagnosis present

## 2022-10-16 DIAGNOSIS — R739 Hyperglycemia, unspecified: Secondary | ICD-10-CM

## 2022-10-16 LAB — CBC
HCT: 45.1 % (ref 39.0–52.0)
Hemoglobin: 16.3 g/dL (ref 13.0–17.0)
MCH: 30.3 pg (ref 26.0–34.0)
MCHC: 36.1 g/dL — ABNORMAL HIGH (ref 30.0–36.0)
MCV: 83.8 fL (ref 80.0–100.0)
Platelets: 312 10*3/uL (ref 150–400)
RBC: 5.38 MIL/uL (ref 4.22–5.81)
RDW: 12.9 % (ref 11.5–15.5)
WBC: 11.5 10*3/uL — ABNORMAL HIGH (ref 4.0–10.5)
nRBC: 0 % (ref 0.0–0.2)

## 2022-10-16 LAB — CBG MONITORING, ED: Glucose-Capillary: 229 mg/dL — ABNORMAL HIGH (ref 70–99)

## 2022-10-16 NOTE — ED Triage Notes (Signed)
Pt reports blood sugars in the 400s x2 days along with emesis. Pt states that he got "a shot of insulin" at [facility] today but pt still feeling sick.

## 2022-10-17 LAB — URINALYSIS, MICROSCOPIC (REFLEX): Bacteria, UA: NONE SEEN

## 2022-10-17 LAB — BASIC METABOLIC PANEL WITH GFR
Anion gap: 13 (ref 5–15)
BUN: 23 mg/dL — ABNORMAL HIGH (ref 6–20)
CO2: 21 mmol/L — ABNORMAL LOW (ref 22–32)
Calcium: 9.1 mg/dL (ref 8.9–10.3)
Chloride: 104 mmol/L (ref 98–111)
Creatinine, Ser: 0.66 mg/dL (ref 0.61–1.24)
GFR, Estimated: >60 mL/min (ref >60)
Glucose, Bld: 253 mg/dL — ABNORMAL HIGH (ref 70–99)
Potassium: 3.9 mmol/L (ref 3.5–5.1)
Sodium: 138 mmol/L (ref 135–145)

## 2022-10-17 LAB — URINALYSIS, ROUTINE W REFLEX MICROSCOPIC
Bilirubin Urine: NEGATIVE
Glucose, UA: >=500 mg/dL — AB
Ketones, ur: NEGATIVE mg/dL
Leukocytes,Ua: NEGATIVE
Nitrite: NEGATIVE
Protein, ur: 100 mg/dL — AB
Specific Gravity, Urine: >=1.03 (ref 1.005–1.030)
pH: 5.5 (ref 5.0–8.0)

## 2022-10-17 MED ORDER — ONDANSETRON HCL 4 MG/2ML IJ SOLN
4.0000 mg | Freq: Once | INTRAMUSCULAR | Status: AC
  Administered 2022-10-17: 4 mg via INTRAVENOUS
  Filled 2022-10-17: qty 2

## 2022-10-17 MED ORDER — ONDANSETRON 4 MG PO TBDP: 4.0000 mg | ORAL_TABLET | Freq: Three times a day (TID) | ORAL | 0 refills | Status: DC | PRN

## 2022-10-17 MED ORDER — SODIUM CHLORIDE 0.9 % IV BOLUS
1000.0000 mL | Freq: Once | INTRAVENOUS | Status: AC
  Administered 2022-10-17: 1000 mL via INTRAVENOUS

## 2022-10-17 MED ORDER — ONDANSETRON 4 MG PO TBDP: 4.0000 mg | ORAL_TABLET | Freq: Three times a day (TID) | ORAL | 0 refills | Status: AC | PRN

## 2022-10-17 NOTE — Discharge Instructions (Addendum)
You were seen in the emergency department today with elevated blood sugars.  I would like for you to continue your diabetes medications as prescribed.  I have called in some nausea medicines.  I have also listed some information on this paperwork about elevated blood sugars and nutrition changes which can help to control your blood sugars better at home.   If develop any new or suddenly worsening symptoms please return to the emergency department such as abdominal pain, confusion, severe vomiting which does not resolve with medication provided.

## 2022-10-17 NOTE — ED Provider Notes (Signed)
Emergency Department Provider Note   I have reviewed the triage vital signs and the nursing notes.   HISTORY  Chief Complaint Emesis and Hyperglycemia   HPI Dale Reed is a 47 y.o. male past history of prior TIA and diabetes presents to the emergency department with elevated blood sugars at home with 2 episodes of vomiting today.  Patient is compliant with his home metformin and glipizide but is not checking his blood sugars regularly at home.  He developed some sweats and fatigue over the past couple of days without fever.  He checked his blood sugar and found it to be in the 400 range.  He went to [facility] today with 1 episode of vomiting in the office.  His blood sugar was high but patient states prior to going to that appointment he ate a stack of pancakes with syrup.  He was discharged home after receiving some insulin in the office.  He had an additional episode of vomiting and so presented to the ED for evaluation. No abdominal pain.    Past Medical History:  Diagnosis Date   Back pain    Chest pain    Diabetes mellitus    SOB (shortness of breath)    TIA (transient ischemic attack) 04/26/2016    Review of Systems  Constitutional: No fever/chills. Cardiovascular: Denies chest pain. Respiratory: Denies shortness of breath. Gastrointestinal: No abdominal pain. Positive nausea and vomiting.  No diarrhea.  No constipation. Musculoskeletal: Negative for back pain. Skin: Negative for rash. Neurological: Negative for headaches.   ____________________________________________   PHYSICAL EXAM:  VITAL SIGNS: ED Triage Vitals  Encounter Vitals Group     BP 10/16/22 2336 (!) 127/98     Pulse Rate 10/16/22 2336 (!) 127     Resp 10/16/22 2336 20     Temp 10/16/22 2336 97.6 F (36.4 C)     Temp Source 10/16/22 2336 Oral     SpO2 10/16/22 2336 97 %     Weight 10/16/22 2336 197 lb (89.4 kg)     Height 10/16/22 2336 5\' 10"  (1.778 m)   Constitutional: Alert and  oriented. Well appearing and in no acute distress. Eyes: Conjunctivae are normal.  Head: Atraumatic. Nose: No congestion/rhinnorhea. Mouth/Throat: Mucous membranes are moist.  Neck: No stridor.   Cardiovascular: Normal rate, regular rhythm. Good peripheral circulation. Grossly normal heart sounds.   Respiratory: Normal respiratory effort.  No retractions. Lungs CTAB. Gastrointestinal: Soft and nontender. No distention.  Musculoskeletal: No lower extremity tenderness nor edema. No gross deformities of extremities. Neurologic:  Normal speech and language.  Skin:  Skin is warm, dry and intact. No rash noted.   ____________________________________________   LABS (all labs ordered are listed, but only abnormal results are displayed)  Labs Reviewed  BASIC METABOLIC PANEL - Abnormal; Notable for the following components:      Result Value   CO2 21 (*)    Glucose, Bld 253 (*)    BUN 23 (*)    All other components within normal limits  CBC - Abnormal; Notable for the following components:   WBC 11.5 (*)    MCHC 36.1 (*)    All other components within normal limits  URINALYSIS, ROUTINE W REFLEX MICROSCOPIC - Abnormal; Notable for the following components:   APPearance HAZY (*)    Glucose, UA >=500 (*)    Hgb urine dipstick TRACE (*)    Protein, ur 100 (*)    All other components within normal limits  CBG MONITORING,  ED - Abnormal; Notable for the following components:   Glucose-Capillary 229 (*)    All other components within normal limits  URINALYSIS, MICROSCOPIC (REFLEX)    ____________________________________________   PROCEDURES  Procedure(s) performed:   Procedures  None  ____________________________________________   INITIAL IMPRESSION / ASSESSMENT AND PLAN / ED COURSE  Pertinent labs & imaging results that were available during my care of the patient were reviewed by me and considered in my medical decision making (see chart for details).   This patient is  Presenting for Evaluation of vomiting/hyperglycemia, which does require a range of treatment options, and is a complaint that involves a high risk of morbidity and mortality.  The Differential Diagnoses includes DKA, HHS, hyperglycemia, GI viral illness, AKI, electrolyte disturbance, etc.  Critical Interventions-    Medications  sodium chloride 0.9 % bolus 1,000 mL (0 mLs Intravenous Stopped 10/17/22 0236)  ondansetron (ZOFRAN) injection 4 mg (4 mg Intravenous Given 10/17/22 0113)    Reassessment after intervention: HR improved.    I did obtain Additional Historical Information from wife at bedside.   Clinical Laboratory Tests Ordered, included hyperglycemia with glucose of 253.  Normal anion gap of 13. UA without infection of ketones.   Radiologic Tests: Considered abdominal imaging but exam is reassuring without focal tenderness.  Defer CT imaging for now.  Cardiac Monitor Tracing which shows NSR.    Social Determinants of Health Risk patient is a non-smoker.   Medical Decision Making: Summary:  Patient presents emergency department with vomiting, hyperglycemia, tachycardia.  No altered mental status or coo small respirations.  Low suspicion for DKA but will need lab evaluation to further exclude this.   Reevaluation with update and discussion with patient. Feeling improved after IVF. HR improved. No DKA on labs. Will continue home medications. Diet is fairly poor and likely contributing to poor glucose control at home. Would advise working on this primarily and allowing PCP to make changes to medications as needed. Patient to call on Monday for ASAP follow up.   Considered admission but symptoms improved. No evidence of DKA.   Patient's presentation is most consistent with acute presentation with potential threat to life or bodily function.   Disposition: discharge  ____________________________________________  FINAL CLINICAL IMPRESSION(S) / ED DIAGNOSES  Final diagnoses:   Hyperglycemia  Nausea and vomiting, unspecified vomiting type    NEW OUTPATIENT MEDICATIONS STARTED DURING THIS VISIT:  Discharge Medication List as of 10/17/2022  1:54 AM     START taking these medications   Details  ondansetron (ZOFRAN-ODT) 4 MG disintegrating tablet Take 1 tablet (4 mg total) by mouth every 8 (eight) hours as needed., Starting Sun 10/17/2022, Normal        Note:  This document was prepared using Dragon voice recognition software and may include unintentional dictation errors.  Alona Bene, MD, [facility]    Fartun Paradiso, Arlyss Repress, MD 10/17/22 [id number]
